# Patient Record
Sex: Female | Born: 1990 | Race: White | Hispanic: No | Marital: Single | Smoking: Never smoker
Health system: Southern US, Community
[De-identification: ages and names within clinical notes are randomized; demographics above are authoritative.]

## PROBLEM LIST (undated history)

## (undated) DIAGNOSIS — N83201 Unspecified ovarian cyst, right side: Secondary | ICD-10-CM

## (undated) DIAGNOSIS — N83202 Unspecified ovarian cyst, left side: Secondary | ICD-10-CM

## (undated) DIAGNOSIS — I10 Essential (primary) hypertension: Secondary | ICD-10-CM

---

## 2008-10-20 ENCOUNTER — Emergency Department (HOSPITAL_COMMUNITY): Admission: EM | Admit: 2008-10-20 | Discharge: 2008-10-20 | Payer: Self-pay | Admitting: Emergency Medicine

## 2008-11-29 ENCOUNTER — Inpatient Hospital Stay (HOSPITAL_COMMUNITY): Admission: AD | Admit: 2008-11-29 | Discharge: 2008-11-29 | Payer: Self-pay | Admitting: Obstetrics and Gynecology

## 2009-06-16 ENCOUNTER — Emergency Department (HOSPITAL_COMMUNITY): Admission: EM | Admit: 2009-06-16 | Discharge: 2009-06-16 | Payer: Self-pay | Admitting: Emergency Medicine

## 2010-03-23 LAB — URINE MICROSCOPIC-ADD ON

## 2010-03-23 LAB — URINALYSIS, ROUTINE W REFLEX MICROSCOPIC
Glucose, UA: NEGATIVE mg/dL
Specific Gravity, Urine: >1.03 — ABNORMAL HIGH (ref 1.005–1.030)
Urobilinogen, UA: 0.2 mg/dL (ref 0.0–1.0)
pH: 5 (ref 5.0–8.0)

## 2010-03-23 LAB — URINE CULTURE

## 2010-03-23 LAB — GLUCOSE, CAPILLARY

## 2010-04-08 LAB — WET PREP, GENITAL
Trich, Wet Prep: NONE SEEN
Yeast Wet Prep HPF POC: NONE SEEN

## 2010-04-08 LAB — URINALYSIS, ROUTINE W REFLEX MICROSCOPIC
Bilirubin Urine: NEGATIVE
Nitrite: NEGATIVE
Specific Gravity, Urine: 1.025 (ref 1.005–1.030)
Urobilinogen, UA: 0.2 mg/dL (ref 0.0–1.0)

## 2010-04-08 LAB — URINE MICROSCOPIC-ADD ON

## 2010-04-08 LAB — GC/CHLAMYDIA PROBE AMP, GENITAL: GC Probe Amp, Genital: POSITIVE — AB

## 2010-04-09 LAB — WET PREP, GENITAL
Clue Cells Wet Prep HPF POC: NONE SEEN
Trich, Wet Prep: NONE SEEN
WBC, Wet Prep HPF POC: NONE SEEN
Yeast Wet Prep HPF POC: NONE SEEN

## 2010-04-09 LAB — URINALYSIS, ROUTINE W REFLEX MICROSCOPIC
Bilirubin Urine: NEGATIVE
Glucose, UA: 100 mg/dL — AB
Ketones, ur: 15 mg/dL — AB
Nitrite: POSITIVE — AB
Protein, ur: >300 mg/dL — AB
Specific Gravity, Urine: 1.02 (ref 1.005–1.030)
Urobilinogen, UA: 2 mg/dL — ABNORMAL HIGH (ref 0.0–1.0)
pH: 5 (ref 5.0–8.0)

## 2010-04-09 LAB — GC/CHLAMYDIA PROBE AMP, GENITAL
Chlamydia, DNA Probe: POSITIVE — AB
GC Probe Amp, Genital: POSITIVE — AB

## 2010-04-09 LAB — URINE CULTURE: Colony Count: >=100000

## 2010-04-09 LAB — URINE MICROSCOPIC-ADD ON

## 2010-04-09 LAB — HCG, QUANTITATIVE, PREGNANCY: hCG, Beta Chain, Quant, S: <2 m[IU]/mL (ref <5)

## 2012-01-05 ENCOUNTER — Encounter (HOSPITAL_COMMUNITY): Payer: Self-pay | Admitting: *Deleted

## 2012-01-05 ENCOUNTER — Other Ambulatory Visit (HOSPITAL_COMMUNITY)
Admission: RE | Admit: 2012-01-05 | Discharge: 2012-01-05 | Disposition: A | Discharge status: Home / Self Care | Payer: BC Managed Care – PPO | Source: Ambulatory Visit | Attending: Emergency Medicine | Admitting: Emergency Medicine

## 2012-01-05 ENCOUNTER — Emergency Department (INDEPENDENT_AMBULATORY_CARE_PROVIDER_SITE_OTHER): Payer: BC Managed Care – PPO

## 2012-01-05 ENCOUNTER — Emergency Department (HOSPITAL_COMMUNITY)
Admission: EM | Admit: 2012-01-05 | Discharge: 2012-01-05 | Disposition: A | Discharge status: Home / Self Care | Payer: BC Managed Care – PPO | Source: Home / Self Care | Attending: Emergency Medicine | Admitting: Emergency Medicine

## 2012-01-05 DIAGNOSIS — J111 Influenza due to unidentified influenza virus with other respiratory manifestations: Secondary | ICD-10-CM

## 2012-01-05 DIAGNOSIS — R1031 Right lower quadrant pain: Secondary | ICD-10-CM

## 2012-01-05 DIAGNOSIS — Z113 Encounter for screening for infections with a predominantly sexual mode of transmission: Secondary | ICD-10-CM | POA: Insufficient documentation

## 2012-01-05 DIAGNOSIS — N912 Amenorrhea, unspecified: Secondary | ICD-10-CM

## 2012-01-05 DIAGNOSIS — N760 Acute vaginitis: Secondary | ICD-10-CM | POA: Insufficient documentation

## 2012-01-05 LAB — POCT URINALYSIS DIP (DEVICE)
Bilirubin Urine: NEGATIVE
Ketones, ur: NEGATIVE mg/dL
Nitrite: NEGATIVE

## 2012-01-05 LAB — POCT PREGNANCY, URINE: Preg Test, Ur: NEGATIVE

## 2012-01-05 MED ORDER — BENZONATATE 200 MG PO CAPS: 200.0000 mg | ORAL_CAPSULE | Freq: Three times a day (TID) | ORAL | Status: DC | PRN

## 2012-01-05 MED ORDER — CEPHALEXIN 500 MG PO CAPS: 500.0000 mg | ORAL_CAPSULE | Freq: Three times a day (TID) | ORAL | Status: DC

## 2012-01-05 MED ORDER — OSELTAMIVIR PHOSPHATE 75 MG PO CAPS: 75.0000 mg | ORAL_CAPSULE | Freq: Two times a day (BID) | ORAL | Status: DC

## 2012-01-05 NOTE — ED Notes (Signed)
C/O various sxs over past 2 months (vomiting, dizziness, intermittent abd pain) - was exposed to black mold (moved out 4 days ago).  Now c/o productive cough, nasal congestion x 4 days.  Has been taking Nyquil Daytime and Nyquil Nighttime.  Also c/o intermittent RLQ pains that "feel like a pulled muscle, but sometimes gets pretty severe, and I usually just sleep it off".  C/O frequent urination, but denies any dysuria.  Denies n/v/d.

## 2012-01-05 NOTE — ED Provider Notes (Signed)
Chief Complaint  Patient presents with  . Cough  . Nasal Congestion  . Abdominal Pain    History of Present Illness:   The patient is a 22 year old female who has had cough and nasal congestion going back about 2 months which he attributes to black mold in her apartment. She recently moved out, but for the past 4 days has had cough productive yellow sputum, nasal congestion with clear rhinorrhea, headache, temperature up to 102, sore throat, abdominal pain in the right groin area is been going on off and on for the past month, and she vomited 3 times today. She wonders if she could be pregnant. Her last menstrual period was November 20. She's had some nausea and vomiting and some urinary frequency.  Review of Systems:  Other than noted above, the patient denies any of the following symptoms. Systemic:  No fever, chills, sweats, fatigue, myalgias, headache, or anorexia. Eye:  No redness, pain or drainage. ENT:  No earache, ear congestion, nasal congestion, sneezing, rhinorrhea, sinus pressure, sinus pain, post nasal drip, or sore throat. Lungs:  No cough, sputum production, wheezing, shortness of breath, or chest pain. GI:  No abdominal pain, nausea, vomiting, or diarrhea.  PMFSH:  Past medical history, family history, social history, meds, and allergies were reviewed.  Physical Exam:   Vital signs:  BP 99/65  Pulse 94  Temp 98.9 F (37.2 C) (Oral)  Resp 18  SpO2 98%  LMP 11/24/2011 General:  Alert, in no distress. Eye:  No conjunctival injection or drainage. Lids were normal. ENT:  TMs and canals were normal, without erythema or inflammation.  Nasal mucosa was clear and uncongested, without drainage.  Mucous membranes were moist.  Pharynx was clear, without exudate or drainage.  There were no oral ulcerations or lesions. Neck:  Supple, no adenopathy, tenderness or mass. Lungs:  No respiratory distress.  Lungs were clear to auscultation, without wheezes, rales or rhonchi.  Breath sounds  were clear and equal bilaterally.  Heart:  Regular rhythm, without gallops, murmers or rubs. Abdomen: Soft, flat, nondistended. There is mild tenderness to palpation in the right lower quadrant and right flank without guarding rebound. No organomegaly or mass. Bowel sounds are normally active. Pelvic: Normal external genitalia, vaginal and cervical mucosa were normal. No cervical motion tenderness. Uterus was normal in size and shape and nontender to palpation. She has mild adnexal tenderness on the right, no mass, no adnexal tenderness or mass in the left. Skin:  Clear, warm, and dry, without rash or lesions.  Labs:   Results for orders placed during the hospital encounter of 01/05/12  POCT URINALYSIS DIP (DEVICE)      Component Value Range   Glucose, UA NEGATIVE  NEGATIVE mg/dL   Bilirubin Urine NEGATIVE  NEGATIVE   Ketones, ur NEGATIVE  NEGATIVE mg/dL   Specific Gravity, Urine 1.025  1.005 - 1.030   Hgb urine dipstick SMALL (*) NEGATIVE   pH 5.5  5.0 - 8.0   Protein, ur NEGATIVE  NEGATIVE mg/dL   Urobilinogen, UA 0.2  0.0 - 1.0 mg/dL   Nitrite NEGATIVE  NEGATIVE   Leukocytes, UA TRACE (*) NEGATIVE  POCT PREGNANCY, URINE      Component Value Range   Preg Test, Ur NEGATIVE  NEGATIVE    Radiology:  Dg Chest 2 View  01/05/2012  *RADIOLOGY REPORT*  Clinical Data: Cough and fever for 3 days.  CHEST - 2 VIEW  Comparison: None.  Findings: Midline trachea.  Normal heart size and  mediastinal contours. No pleural effusion or pneumothorax.  Lateral view degraded by patient arm position.  Clear lungs.  IMPRESSION: No acute cardiopulmonary disease.   Original Report Authenticated By: Jeronimo Greaves, M.D.    I reviewed the images independently and personally and concur with the radiologist's findings.  Assessment:  The primary encounter diagnosis was Influenza-like illness. Diagnoses of RLQ abdominal pain and Amenorrhea were also pertinent to this visit.  The cause of her right lower quadrant pain is  uncertain. Could be due to urinary tract infection and a culture was obtained. I went ahead and start her on cephalexin for this possibility. Also is possible it could be mild PID or ovarian cyst. I suggested she followup with a gynecologist and given the phone number of the faculty practice at [facility] hospital to call if the pain is persistent. She did not appear to be pregnant today. I told her that if her menses did not start in a week to repeat a pregnancy test.  Plan:   1.  The following meds were prescribed:   New Prescriptions   BENZONATATE (TESSALON) 200 MG CAPSULE    Take 1 capsule (200 mg total) by mouth 3 (three) times daily as needed for cough.   CEPHALEXIN (KEFLEX) 500 MG CAPSULE    Take 1 capsule (500 mg total) by mouth 3 (three) times daily.   OSELTAMIVIR (TAMIFLU) 75 MG CAPSULE    Take 1 capsule (75 mg total) by mouth every 12 (twelve) hours.   2.  The patient was instructed in symptomatic care and handouts were given. 3.  The patient was told to return if becoming worse in any way, if no better in 3 or 4 days, and given some red flag symptoms that would indicate earlier return.   Reuben Likes, MD 01/05/12 2039

## 2012-01-07 LAB — URINE CULTURE: Special Requests: NORMAL

## 2012-01-10 ENCOUNTER — Telehealth (HOSPITAL_COMMUNITY): Payer: Self-pay | Admitting: Emergency Medicine

## 2012-01-10 MED ORDER — METRONIDAZOLE 500 MG PO TABS: 500.0000 mg | ORAL_TABLET | Freq: Two times a day (BID) | ORAL | Status: DC

## 2012-01-10 MED ORDER — AZITHROMYCIN 500 MG PO TABS: 1000.0000 mg | ORAL_TABLET | Freq: Every day | ORAL | Status: DC

## 2012-01-10 NOTE — ED Notes (Signed)
The patient's DNA probes came back positive for Chlamydia and Gardnerella. We will send him prescriptions for Flagyl 500 mg twice a day for one week and azithromycin 1000 mg by mouth.  Reuben Likes, MD 01/10/12 1345

## 2012-01-10 NOTE — Telephone Encounter (Signed)
Message copied by Reuben Likes on Mon Jan 10, 2012  1:45 PM ------      Message from: Vassie Moselle      Created: Fri Jan 07, 2012 11:22 PM       Chlamydia pos., and Gardnerella pos.  Needs tx.      Vassie Moselle      01/07/2012

## 2012-01-13 ENCOUNTER — Telehealth (HOSPITAL_COMMUNITY): Payer: Self-pay | Admitting: *Deleted

## 2012-01-13 NOTE — ED Notes (Signed)
GC neg., Chlamydia pos., Affirm: Candida and Trich neg., Gardnerella pos.  Dr. Lorenz Coaster e-prescribed Zithromax and Flagyl to [facility].  I called and left a message to call.  Pt. Called right back.  Pt. verified x 2 and given results.  Pt. told she needs Zithromax for the Chlamydia and Flagyl for bacterial vaginosis and [facility] are at [facility] [address]   Pt. instructed to no alcohol while taking the Flagyl.  Pt. instructed to notify her partner, no sex for 1 week and to practice safe sex. Pt. told she can get HIV testing at the [facility]. STD clinic, by appointment.  Pt. voiced understanding.  DHHS form completed and faxed to the [facility]. Vassie Moselle 01/13/2012

## 2012-04-06 ENCOUNTER — Encounter: Payer: Self-pay | Admitting: Obstetrics & Gynecology

## 2012-04-06 ENCOUNTER — Ambulatory Visit (INDEPENDENT_AMBULATORY_CARE_PROVIDER_SITE_OTHER): Payer: BC Managed Care – PPO | Admitting: Obstetrics & Gynecology

## 2012-04-06 VITALS — BP 124/80 | HR 97 | Temp 98.8°F | Ht 64.0 in | Wt 253.0 lb

## 2012-04-06 DIAGNOSIS — Z01419 Encounter for gynecological examination (general) (routine) without abnormal findings: Secondary | ICD-10-CM

## 2012-04-06 DIAGNOSIS — N926 Irregular menstruation, unspecified: Secondary | ICD-10-CM

## 2012-04-06 DIAGNOSIS — N970 Female infertility associated with anovulation: Secondary | ICD-10-CM

## 2012-04-06 LAB — POCT URINALYSIS DIPSTICK
Bilirubin, UA: NEGATIVE
Glucose, UA: NEGATIVE
Leukocytes, UA: NEGATIVE
Nitrite, UA: NEGATIVE
Urobilinogen, UA: NEGATIVE
pH, UA: 5

## 2012-04-06 LAB — POCT URINE PREGNANCY: Preg Test, Ur: NEGATIVE

## 2012-04-06 NOTE — Progress Notes (Signed)
Subjective:     Diana Bowman is a 22 y.o. female here for a routine exam.  Current complaints: Pt states she is trying to conceive for 1 year. Pt states she is tracking her cycles and timing intercourse. Pt states she has been trying for about 1 year without success. Pt states she her cycles are 3-4 days but can go 6 weeks or more between cycles.    Gynecologic History Patient's last menstrual period was 03/29/2012. Contraception: none Last Pap: 2012. Results were: normal   The following portions of the patient's history were reviewed and updated as appropriate: allergies, current medications, past family history, past medical history, past social history, past surgical history and problem list.  Review of Systems Integument/breast: negative for acne, excessive hair Endocrine: negative for temperature intolerance    Objective:    BP 124/80  Pulse 97  Temp(Src) 98.8 F (37.1 C)  Ht 5\' 4"  (1.626 m)  Wt 114.76 kg (253 lb)  BMI 43.41 kg/m2  LMP 03/29/2012  General Appearance:    Alert, cooperative, no distress, appears stated age  Head:    Normocephalic, without obvious abnormality, atraumatic  Eyes:    PERRL, conjunctiva/corneas clear, EOM's intact, fundi    benign, both eyes  Ears:    Normal TM's and external ear canals, both ears  Nose:   Nares normal, septum midline, mucosa normal, no drainage    or sinus tenderness  Throat:   Lips, mucosa, and tongue normal; teeth and gums normal  Neck:   Supple, symmetrical, trachea midline, no adenopathy;    thyroid:  no enlargement/tenderness/nodules; no carotid   bruit or JVD  Back:     Symmetric, no curvature, ROM normal, no CVA tenderness  Lungs:     Clear to auscultation bilaterally, respirations unlabored  Chest Wall:    No tenderness or deformity   Heart:    Regular rate and rhythm, S1 and S2 normal, no murmur, rub   or gallop  Breast Exam:    No tenderness, masses, or nipple abnormality  Abdomen:     Soft, non-tender, bowel  sounds active all four quadrants,    no masses, no organomegaly  Genitalia:    Normal female without lesion, discharge or tenderness  Rectal:    Normal tone, normal prostate, no masses or tenderness;   guaiac negative stool  Extremities:   Extremities normal, atraumatic, no cyanosis or edema  Pulses:   2+ and symmetric all extremities  Skin:   Skin color, texture, turgor normal, no rashes or lesions  Lymph nodes:   Cervical, supraclavicular, and axillary nodes normal  Neurologic:   CNII-XII intact, normal strength, sensation and reflexes    throughout      Assessment:   Primary infertility Oligo ovulation--DDX simple obesity, PCOS  Plan:   Labs Ultrasound

## 2012-04-06 NOTE — Patient Instructions (Addendum)
Abnormal Uterine Bleeding Abnormal uterine bleeding can have many causes. Some cases are simply treated, while others are more serious. There are several kinds of bleeding that is considered abnormal, including:  Bleeding between periods.  Bleeding after sexual intercourse.  Spotting anytime in the menstrual cycle.  Bleeding heavier or more than normal.  Bleeding after menopause. CAUSES  There are many causes of abnormal uterine bleeding. It can be present in teenagers, pregnant women, women during their reproductive years, and women who have reached menopause. Your caregiver will look for the more common causes depending on your age, signs, symptoms and your particular circumstance. Most cases are not serious and can be treated. Even the more serious causes, like cancer of the female organs, can be treated adequately if found in the early stages. That is why all types of bleeding should be evaluated and treated as soon as possible. DIAGNOSIS  Diagnosing the cause may take several kinds of tests. Your caregiver may:  Take a complete history of the type of bleeding.  Perform a complete physical exam and Pap smear.  Take an ultrasound on the abdomen showing a picture of the female organs and the pelvis.  Inject dye into the uterus and Fallopian tubes and X-ray them (hysterosalpingogram).  Place fluid in the uterus and do an ultrasound (sonohysterogrqphy).  Take a CT scan to examine the female organs and pelvis.  Take an MRI to examine the female organs and pelvis. There is no X-ray involved with this procedure.  Look inside the uterus with a telescope that has a light at the end (hysteroscopy).  Scrap the inside of the uterus to get tissue to examine (Dilatation and Curettage, D&C).  Look into the pelvis with a telescope that has a light at the end (laparoscopy). This is done through a very small cut (incision) in the abdomen. TREATMENT  Treatment will depend on the cause of the  abnormal bleeding. It can include:  Doing nothing to allow the problem to take care of itself over time.  Hormone treatment.  Birth control pills.  Treating the medical condition causing the problem.  Laparoscopy.  Major or minor surgery  Destroying the lining of the uterus with electrical currant, laser, freezing or heat (uterine ablation). HOME CARE INSTRUCTIONS   Follow your caregiver's recommendation on how to treat your problem.  See your caregiver if you missed a menstrual period and think you may be pregnant.  If you are bleeding heavily, count the number of pads/tampons you use and how often you have to change them. Tell this to your caregiver.  Avoid sexual intercourse until the problem is controlled. SEEK MEDICAL CARE IF:   You have any kind of abnormal bleeding mentioned above.  You feel dizzy at times.  You are 22 years old and have not had a menstrual period yet. SEEK IMMEDIATE MEDICAL CARE IF:   You pass out.  You are changing pads/tampons every 15 to 30 minutes.  You have belly (abdominal) pain.  You have a temperature of 100 F (37.8 C) or higher.  You become sweaty or weak.  You are passing large blood clots from the vagina.  You start to feel sick to your stomach (nauseous) and throw up (vomit). Document Released: 12/21/2004 Document Revised: 03/15/2011 Document Reviewed: 05/16/2008 [facility] Patient Information 2013 [place], [place].  Infertility WHAT IS INFERTILITY?  Infertility is usually defined as not being able to get pregnant after trying for one year of regular sexual intercourse without the use of contraceptives.  Or not being able to carry a pregnancy to term and have a baby. The infertility rate in the [place] is around 10%. Pregnancy is the result of a chain of events. A woman must release an egg from one of her ovaries (ovulation). The egg must be fertilized by the female sperm. Then it travels through a fallopian tube into the  uterus (womb), where it attaches to the wall of the uterus and grows. A man must have enough sperm, and the sperm must join with (fertilize) the egg along the way, at the proper time. The fertilized egg must then become attached to the inside of the uterus. While this may seem simple, many things can happen to prevent pregnancy from occurring.  WHOSE PROBLEM IS IT?  About 20% of infertility cases are due to problems with the man (female factors) and 65% are due to problems with the woman (female factors). Other cases are due to a combination of female and female factors or to unknown causes.  WHAT CAUSES INFERTILITY IN MEN?  Infertility in men is often caused by problems with making enough normal sperm or getting the sperm to reach the egg. Problems with sperm may exist from birth or develop later in life, due to illness or injury. Some men produce no sperm, or produce too few sperm (oligospermia). Other problems include:  Sexual dysfunction.  Hormonal or endocrine problems.  Age. Female fertility decreases with age, but not at as young an age as female fertility.  Infection.  Congenital problems. Birth defect, such as absence of the tubes that carry the sperm (vas deferens).  Genetic/chromosomal problems.  Antisperm antibody problems.  Retrograde ejaculation (sperm go into the bladder).  Varicoceles, spematoceles, or tumors of the testicles.  Lifestyle can influence the number and quality of a man's sperm.  Alcohol and drugs can temporarily reduce sperm quality.  Environmental toxins, including pesticides and lead, may cause some cases of infertility in men. WHAT CAUSES INFERTILITY IN WOMEN?   Problems with ovulation account for most infertility in women. Without ovulation, eggs are not available to be fertilized.  Signs of problems with ovulation include irregular menstrual periods or no periods at all.  Simple lifestyle factors, including stress, diet, or athletic training, can affect  a woman's hormonal balance.  Age. Fertility begins to decrease in women in the early 80s and is worse after age 67.  Much less often, a hormonal imbalance from a serious medical problem, such as a pituitary gland tumor, thyroid or other chronic medical disease, can cause ovulation problems.  Pelvic infections.  Polycystic ovary syndrome (increase in female hormones, unable to ovulate).  Alcohol or illegal drugs.  Environmental toxins, radiation, pesticides, and certain chemicals.  Aging is an important factor in female infertility.  The ability of a woman's ovaries to produce eggs declines with age, especially after age 36. About one third of couples where the woman is over 35 will have problems with fertility.  By the time she reaches menopause when her monthly periods stop for good, a woman can no longer produce eggs or become pregnant.  Other problems can also lead to infertility in women. If the fallopian tubes are blocked at one or both ends, the egg cannot travel through the tubes into the uterus. Scar tissue (adhesions) in the pelvis may cause blocked tubes. This may result from pelvic inflammatory disease, endometriosis, or surgery for an ectopic pregnancy (fertilized egg implanted outside the uterus) or any pelvic or abdominal surgery causing adhesions.  Fibroid tumors or polyps of the uterus.  Congenital (birth defect) abnormalities of the uterus.  Infection of the cervix (cervicitis).  Cervical stenosis (narrowing).  Abnormal cervical mucus.  Polycystic ovary syndrome.  Having sexual intercourse too often (every other day or 4 to 5 times a week).  Obesity.  Anorexia.  Poor nutrition.  Over exercising, with loss of body fat.  DES. Your mother received diethylstilbesterol hormone when pregnant with you. HOW IS INFERTILITY TESTED?  If you have been trying to have a baby without success, you may want to seek medical help. You should not wait for one year of trying  before seeing a health care provider if:  You are over 35.  You have reason to believe that there may be a fertility problem. A medical evaluation may determine the reasons for a couple's infertility. Usually this process begins with:  Physical exams.  Medical histories of both partners.  Sexual histories of both partners. If there is no obvious problem, like improperly timed intercourse or absence of ovulation, tests may be needed.   For a man, testing usually begins with tests of his semen to look at:  The number of sperm.  The shape of sperm.  Movement of his sperm.  Taking a complete medical and surgical history.  Physical examination.  Check for infection of the female reproductive organs. Sometimes hormone tests are done.   For a woman, the first step in testing is to find out if she is ovulating each month. There are several ways to do this. For example, she can keep track of changes in her morning body temperature and in the texture of her cervical mucus. Another tool is a home ovulation test kit, which can be bought at drug or grocery stores.  Checks of ovulation can also be done in the doctor's office, using blood tests for hormone levels or ultrasound tests of the ovaries. If the woman is ovulating, more tests will need to be done. Some common female tests include:  Hysterosalpingogram: An x-ray of the fallopian tubes and uterus after they are injected with dye. It shows if the tubes are open and shows the shape of the uterus.  Laparoscopy: An exam of the tubes and other female organs for disease. A lighted tube called a laparoscope is used to see inside the abdomen.  Endometrial biopsy: Sample of uterus tissue taken on the first day of the menstrual period, to see if the tissue indicates you are ovulating.  Transvaginal ultrasound: Examines the female organs.  Hysteroscopy: Uses a lighted tube to examine the cervix and inside the uterus, to see if there are any  abnormalities inside the uterus. TREATMENT  Depending on the test results, different treatments can be suggested. The type of treatment depends on the cause. 85 to 90% of infertility cases are treated with drugs or surgery.   Various fertility drugs may be used for women with ovulation problems. It is important to talk with your caregiver about the drug to be used. You should understand the drug's benefits and side effects. Depending on the type of fertility drug and the dosage of the drug used, multiple births (twins or multiples) can occur in some women.  If needed, surgery can be done to repair damage to a woman's ovaries, fallopian tubes, cervix, or uterus.  Surgery or medical treatment for endometriosis or polycystic ovary syndrome. Sometimes a man has an infertility problem that can be corrected with medicine or by surgery.  Intrauterine insemination (IUI) of  sperm, timed with ovulation.  Change in lifestyle, if that is the cause (lose weight, increase exercise, and stop smoking, drinking excessively, or taking illegal drugs).  Other types of surgery:  Removing growths inside and on the uterus.  Removing scar tissue from inside of the uterus.  Fixing blocked tubes.  Removing scar tissue in the pelvis and around the female organs. WHAT IS ASSISTED REPRODUCTIVE TECHNOLOGY (ART)?  Assisted reproductive technology (ART) is another form of special methods used to help infertile couples. ART involves handling both the woman's eggs and the man's sperm. Success rates vary and depend on many factors. ART can be expensive and time-consuming. But ART has made it possible for many couples to have children that otherwise would not have been conceived. Some methods are listed below:  In vitro fertilization (IVF). IVF is often used when a woman's fallopian tubes are blocked or when a man has low sperm counts. A drug is used to stimulate the ovaries to produce multiple eggs. Once mature, the eggs are  removed and placed in a culture dish with the man's sperm for fertilization. After about 40 hours, the eggs are examined to see if they have become fertilized by the sperm and are dividing into cells. These fertilized eggs (embryos) are then placed in the woman's uterus. This bypasses the fallopian tubes.  Gamete intrafallopian transfer (GIFT) is similar to IVF, but used when the woman has at least one normal fallopian tube. Three to five eggs are placed in the fallopian tube, along with the man's sperm, for fertilization inside the woman's body.  Zygote intrafallopian transfer (ZIFT), also called tubal embryo transfer, combines IVF and GIFT. The eggs retrieved from the woman's ovaries are fertilized in the lab and placed in the fallopian tubes rather than in the uterus.  ART procedures sometimes involve the use of donor eggs (eggs from another woman) or previously frozen embryos. Donor eggs may be used if a woman has impaired ovaries or has a genetic disease that could be passed on to her baby.  When performing ART, you are at higher risk for resulting in multiple pregnancies, twins, triplets or more.  Intracytoplasma sperm injection is a procedure that injects a single sperm into the egg to fertilize it.  Embryo transplant is a procedure that starts after growing an embryo in a special media (chemical solution) developed to keep the embryo alive for 2 to 5 days, and then transplanting it into the uterus. In cases where a cause cannot be found and pregnancy does not occur, adoption may be a consideration. Document Released: 12/24/2002 Document Revised: 03/15/2011 Document Reviewed: 11/19/2008 [facility] Patient Information 2013 [place], [place].

## 2012-04-07 LAB — TESTOSTERONE, FREE, TOTAL, SHBG
Testosterone, Free: 11.9 pg/mL — ABNORMAL HIGH (ref 0.6–6.8)
Testosterone-% Free: 2.6 % — ABNORMAL HIGH (ref 0.4–2.4)

## 2012-04-07 LAB — HIV ANTIBODY (ROUTINE TESTING W REFLEX): HIV: NONREACTIVE

## 2012-04-07 LAB — PROLACTIN: Prolactin: 14.2 ng/mL

## 2012-04-10 LAB — PAP IG AND CT-NG NAA: GC Probe Amp: NEGATIVE

## 2012-04-10 LAB — 17-HYDROXYPROGESTERONE: 17-OH-Progesterone, LC/MS/MS: 39 ng/dL

## 2012-04-11 ENCOUNTER — Encounter: Payer: Self-pay | Admitting: Obstetrics & Gynecology

## 2012-04-11 DIAGNOSIS — R896 Abnormal cytological findings in specimens from other organs, systems and tissues: Secondary | ICD-10-CM

## 2012-04-19 ENCOUNTER — Other Ambulatory Visit: Payer: BC Managed Care – PPO

## 2012-04-26 ENCOUNTER — Other Ambulatory Visit: Payer: Self-pay | Admitting: Obstetrics & Gynecology

## 2012-04-26 ENCOUNTER — Ambulatory Visit: Payer: BC Managed Care – PPO | Admitting: Obstetrics & Gynecology

## 2012-04-26 DIAGNOSIS — N979 Female infertility, unspecified: Secondary | ICD-10-CM

## 2012-04-26 DIAGNOSIS — N926 Irregular menstruation, unspecified: Secondary | ICD-10-CM

## 2012-05-02 ENCOUNTER — Ambulatory Visit (INDEPENDENT_AMBULATORY_CARE_PROVIDER_SITE_OTHER): Payer: BC Managed Care – PPO

## 2012-05-02 DIAGNOSIS — N926 Irregular menstruation, unspecified: Secondary | ICD-10-CM

## 2012-05-02 DIAGNOSIS — N979 Female infertility, unspecified: Secondary | ICD-10-CM

## 2012-05-06 ENCOUNTER — Encounter: Payer: Self-pay | Admitting: Obstetrics

## 2012-05-19 ENCOUNTER — Ambulatory Visit: Payer: BC Managed Care – PPO | Admitting: Obstetrics & Gynecology

## 2012-05-29 ENCOUNTER — Encounter: Payer: Self-pay | Admitting: Obstetrics & Gynecology

## 2012-06-05 ENCOUNTER — Ambulatory Visit: Payer: BC Managed Care – PPO | Admitting: Obstetrics & Gynecology

## 2012-08-22 ENCOUNTER — Other Ambulatory Visit: Payer: Self-pay | Admitting: Obstetrics

## 2012-08-22 DIAGNOSIS — N63 Unspecified lump in unspecified breast: Secondary | ICD-10-CM

## 2012-08-31 ENCOUNTER — Other Ambulatory Visit: Payer: Self-pay | Admitting: Obstetrics

## 2012-08-31 ENCOUNTER — Ambulatory Visit
Admission: RE | Admit: 2012-08-31 | Discharge: 2012-08-31 | Disposition: A | Discharge status: Home / Self Care | Payer: BC Managed Care – PPO | Source: Ambulatory Visit | Attending: Obstetrics | Admitting: Obstetrics

## 2012-08-31 ENCOUNTER — Ambulatory Visit: Payer: BC Managed Care – PPO | Admitting: Obstetrics & Gynecology

## 2012-08-31 DIAGNOSIS — N63 Unspecified lump in unspecified breast: Secondary | ICD-10-CM

## 2012-09-05 ENCOUNTER — Other Ambulatory Visit: Payer: BC Managed Care – PPO

## 2012-09-05 ENCOUNTER — Other Ambulatory Visit: Payer: Self-pay | Admitting: Obstetrics & Gynecology

## 2012-09-05 DIAGNOSIS — N63 Unspecified lump in unspecified breast: Secondary | ICD-10-CM

## 2012-09-07 ENCOUNTER — Ambulatory Visit: Payer: BC Managed Care – PPO | Admitting: Obstetrics & Gynecology

## 2012-09-07 ENCOUNTER — Ambulatory Visit
Admission: RE | Admit: 2012-09-07 | Discharge: 2012-09-07 | Disposition: A | Discharge status: Home / Self Care | Payer: BC Managed Care – PPO | Source: Ambulatory Visit | Attending: Obstetrics & Gynecology | Admitting: Obstetrics & Gynecology

## 2012-09-07 DIAGNOSIS — N63 Unspecified lump in unspecified breast: Secondary | ICD-10-CM

## 2012-11-26 ENCOUNTER — Emergency Department (HOSPITAL_COMMUNITY)
Admission: EM | Admit: 2012-11-26 | Discharge: 2012-11-26 | Disposition: A | Discharge status: Home / Self Care | Payer: BC Managed Care – PPO | Attending: Emergency Medicine | Admitting: Emergency Medicine

## 2012-11-26 ENCOUNTER — Emergency Department (INDEPENDENT_AMBULATORY_CARE_PROVIDER_SITE_OTHER)
Admission: EM | Admit: 2012-11-26 | Discharge: 2012-11-26 | Disposition: A | Discharge status: Hospice | Payer: BC Managed Care – PPO | Source: Home / Self Care | Attending: Emergency Medicine | Admitting: Emergency Medicine

## 2012-11-26 ENCOUNTER — Encounter (HOSPITAL_COMMUNITY): Payer: Self-pay | Admitting: Emergency Medicine

## 2012-11-26 DIAGNOSIS — N12 Tubulo-interstitial nephritis, not specified as acute or chronic: Secondary | ICD-10-CM

## 2012-11-26 DIAGNOSIS — R509 Fever, unspecified: Secondary | ICD-10-CM | POA: Insufficient documentation

## 2012-11-26 DIAGNOSIS — Z3202 Encounter for pregnancy test, result negative: Secondary | ICD-10-CM | POA: Insufficient documentation

## 2012-11-26 DIAGNOSIS — R630 Anorexia: Secondary | ICD-10-CM | POA: Insufficient documentation

## 2012-11-26 DIAGNOSIS — R112 Nausea with vomiting, unspecified: Secondary | ICD-10-CM | POA: Insufficient documentation

## 2012-11-26 DIAGNOSIS — R109 Unspecified abdominal pain: Secondary | ICD-10-CM

## 2012-11-26 DIAGNOSIS — R197 Diarrhea, unspecified: Secondary | ICD-10-CM | POA: Insufficient documentation

## 2012-11-26 LAB — COMPREHENSIVE METABOLIC PANEL
ALT: 35 U/L (ref 0–35)
AST: 23 U/L (ref 0–37)
Albumin: 3.7 g/dL (ref 3.5–5.2)
Alkaline Phosphatase: 76 U/L (ref 39–117)
BUN: 12 mg/dL (ref 6–23)
CO2: 21 mEq/L (ref 19–32)
Calcium: 8.6 mg/dL (ref 8.4–10.5)
Chloride: 109 mEq/L (ref 96–112)
Creatinine, Ser: 0.61 mg/dL (ref 0.50–1.10)
GFR calc Af Amer: >90 mL/min (ref >90)
GFR calc non Af Amer: >90 mL/min (ref >90)
Glucose, Bld: 117 mg/dL — ABNORMAL HIGH (ref 70–99)
Potassium: 4 mEq/L (ref 3.5–5.1)
Sodium: 142 mEq/L (ref 135–145)
Total Bilirubin: 0.6 mg/dL (ref 0.3–1.2)
Total Protein: 7.1 g/dL (ref 6.0–8.3)

## 2012-11-26 LAB — LIPASE, BLOOD: Lipase: 17 U/L (ref 11–59)

## 2012-11-26 LAB — URINALYSIS, ROUTINE W REFLEX MICROSCOPIC
Glucose, UA: NEGATIVE mg/dL
Protein, ur: NEGATIVE mg/dL
Specific Gravity, Urine: 1.024 (ref 1.005–1.030)
pH: 6 (ref 5.0–8.0)

## 2012-11-26 LAB — CBC
HCT: 44.6 % (ref 36.0–46.0)
Hemoglobin: 15.7 g/dL — ABNORMAL HIGH (ref 12.0–15.0)
MCH: 31.7 pg (ref 26.0–34.0)
MCHC: 35.2 g/dL (ref 30.0–36.0)
MCV: 90.1 fL (ref 78.0–100.0)
Platelets: 269 10*3/uL (ref 150–400)
RBC: 4.95 MIL/uL (ref 3.87–5.11)
RDW: 13.6 % (ref 11.5–15.5)
WBC: 19.8 10*3/uL — ABNORMAL HIGH (ref 4.0–10.5)

## 2012-11-26 LAB — URINE MICROSCOPIC-ADD ON

## 2012-11-26 MED ORDER — SODIUM CHLORIDE 0.9 % IV SOLN
Freq: Once | INTRAVENOUS | Status: AC
  Administered 2012-11-26: 10:00:00 via INTRAVENOUS

## 2012-11-26 MED ORDER — CIPROFLOXACIN HCL 500 MG PO TABS: 500.0000 mg | ORAL_TABLET | Freq: Two times a day (BID) | ORAL | Status: DC

## 2012-11-26 MED ORDER — ONDANSETRON HCL 4 MG PO TABS: 4.0000 mg | ORAL_TABLET | Freq: Four times a day (QID) | ORAL | Status: DC

## 2012-11-26 MED ORDER — ONDANSETRON HCL 4 MG/2ML IJ SOLN
4.0000 mg | Freq: Once | INTRAMUSCULAR | Status: AC
  Administered 2012-11-26: 4 mg via INTRAVENOUS
  Filled 2012-11-26: qty 2

## 2012-11-26 MED ORDER — ONDANSETRON 4 MG PO TBDP
ORAL_TABLET | ORAL | Status: AC
  Filled 2012-11-26: qty 2

## 2012-11-26 MED ORDER — SODIUM CHLORIDE 0.9 % IV BOLUS (SEPSIS)
1000.0000 mL | INTRAVENOUS | Status: AC
  Administered 2012-11-26: 1000 mL via INTRAVENOUS

## 2012-11-26 MED ORDER — ONDANSETRON 4 MG PO TBDP
8.0000 mg | ORAL_TABLET | Freq: Once | ORAL | Status: AC
  Administered 2012-11-26: 8 mg via ORAL

## 2012-11-26 NOTE — ED Notes (Signed)
C/o vomiting and diarrhea since 2:30am States she thinks she was food poison Ate at [employer]. States partner ate at restaurant and vomited once and was fine after that.

## 2012-11-26 NOTE — ED Provider Notes (Signed)
CSN: [id number]     Arrival date & time 11/26/12  1148 History   First MD Initiated Contact with Patient 11/26/12 1151     Chief Complaint  Patient presents with  . Abdominal Pain   (Consider location/radiation/quality/duration/timing/severity/associated sxs/prior Treatment) HPI Pt is a 22yo female c/o generalized abdominal pain that is aching and sore associated with n/v/d that started 12 hours ago. Pt initially went to [facility] but was directed to ED for further evaluation of abdominal pain for further workup of possible cholecystitis vs gastroenteritis.  Pt reports eating [place] food that consisted of various meats and cheeses.  After that, she began to feel sick.  Her friend who was also eating with her vomited one time but was better after that, he also ate something different than the pt.  Pt reports she has vomitined 7-8 times since 2:30am this morning and had 5-6 episodes of watery diarrhea.  Has not tried anything at home for symptoms. Denies urinary or vaginal symptoms.  Denies recent travel. Denies hx of previous abdominal problems or surgeries. LKMP: September, pt reports normally has irregular periods, is not on birth control.   History reviewed. No pertinent past medical history. History reviewed. No pertinent past surgical history. Family History  Problem Relation Age of Onset  . Diabetes Maternal Grandmother   . Heart attack Maternal Grandfather   . Kidney disease Paternal Grandfather   . Diabetes Paternal Grandfather   . Diabetes Maternal Aunt    History  Substance Use Topics  . Smoking status: Never Smoker   . Smokeless tobacco: Not on file  . Alcohol Use: Yes     Comment: occasional   OB History   Grav Para Term Preterm Abortions TAB SAB Ect Mult Living                 Review of Systems  Constitutional: Positive for fever ( "low grade") and appetite change. Negative for chills and unexpected weight change.  Respiratory: Negative for shortness of breath.    Cardiovascular: Negative for chest pain.  Gastrointestinal: Positive for nausea, vomiting, abdominal pain ( centralized) and diarrhea. Negative for constipation.  Genitourinary: Negative for dysuria, frequency, hematuria, flank pain, decreased urine volume, vaginal bleeding, vaginal discharge, vaginal pain and menstrual problem.  All other systems reviewed and are negative.    Allergies  Review of patient's allergies indicates no known allergies.  Home Medications   Current Outpatient Rx  Name  Route  Sig  Dispense  Refill  . ondansetron (ZOFRAN) 4 MG tablet   Oral   Take 1 tablet (4 mg total) by mouth every 6 (six) hours.   12 tablet   0   . ciprofloxacin (CIPRO) 500 MG tablet   Oral   Take 1 tablet (500 mg total) by mouth every 12 (twelve) hours.   20 tablet   0   . ondansetron (ZOFRAN) 4 MG tablet   Oral   Take 1 tablet (4 mg total) by mouth every 6 (six) hours.   12 tablet   0    BP 117/81  Pulse 113  Temp(Src) 101 F (38.3 C) (Oral)  Resp 16  SpO2 98%  LMP 09/23/2012 Physical Exam  Nursing note and vitals reviewed. Constitutional: She appears well-developed and well-nourished. No distress.  Morbidly obese female lying in exam bed, holding emesis bag. No active vomiting.  HENT:  Head: Normocephalic and atraumatic.  Mouth/Throat: Oropharynx is clear and moist. No oropharyngeal exudate.  Eyes: Conjunctivae are normal. No scleral icterus.  Neck: Normal range of motion. Neck supple.  Cardiovascular: Normal rate, regular rhythm and normal heart sounds.   Pulmonary/Chest: Effort normal and breath sounds normal. No respiratory distress. She has no wheezes. She has no rales. She exhibits no tenderness.  Abdominal: Soft. She exhibits no distension and no mass. Bowel sounds are increased. There is tenderness ( mild, diffuse tenderness). There is no rebound and no guarding.  Obese abdomen, increased bowel sounds, soft, mild diffuse tenderness. Exam limited due to body  habitus.   Musculoskeletal: Normal range of motion.  Neurological: She is alert.  Skin: Skin is warm and dry. She is not diaphoretic.    ED Course  Procedures (including critical care time) Labs Review Labs Reviewed  CBC - Abnormal; Notable for the following:    WBC 19.8 (*)    Hemoglobin 15.7 (*)    All other components within normal limits  COMPREHENSIVE METABOLIC PANEL - Abnormal; Notable for the following:    Glucose, Bld 117 (*)    All other components within normal limits  URINALYSIS, ROUTINE W REFLEX MICROSCOPIC - Abnormal; Notable for the following:    APPearance CLOUDY (*)    Hgb urine dipstick TRACE (*)    Nitrite POSITIVE (*)    Leukocytes, UA TRACE (*)    All other components within normal limits  URINE MICROSCOPIC-ADD ON - Abnormal; Notable for the following:    Squamous Epithelial / LPF FEW (*)    Bacteria, UA MANY (*)    All other components within normal limits  URINE CULTURE  LIPASE, BLOOD  POCT PREGNANCY, URINE   Imaging Review No results found.  EKG Interpretation   None       MDM   1. Pyelonephritis   2. Nausea vomiting and diarrhea    Pt presenting with n/v/d. Reports friend who ate same food had similar, but less severe GI symptoms.  Due to number of episodes of vomiting and diarrhea, as well as UCC concern for cholecystitis with positive Murphy sign at urgent care, although diffuse mild tenderness on exam in ED, will get blood work.  Symptoms most likely due to gastroenteritis from food, however want to ensure no other underlying cause for pt's symptoms as well as check for hypokalemia.  Pt has hx of irregular menstrual cycles but not on birth control. Will get urine pregnancy. Pt denies urinary or vaginal symptoms, do not believe pelvic exam is indicated at this time.  Tx: fluids and zofran.    Urine preg: negative  Labs: CBC- elevated WBC: 19.8 CMP: unremarkable Lipase: WNL  1:21PM- Pt resting comfortably in exam bed. States she just feels  a little uncomfortable but nausea is gone and she does not want any pain medication at this time.  Discussed labs with Dr. Silverio Lay,  Will check UA for another cause of elevated WBC UA: evidence of UTI, will tx for pyelonephritis as pt has systemic symptoms that may not be related solely to gastroenteritis.  Pt is still denies abdominal pain and is non-tender on exam. Do not believe CT or U/S abdomen is needed at this time.  All labs/imaging/findings discussed with patient. All questions answered and concerns addressed. Will discharge pt home and have pt f/u with [facility] and [facility] info provided. Return precautions given. Pt verbalized understanding and agreement with tx plan. Vitals: unremarkable. Discharged in stable condition.    Discussed pt with Dr. Silverio Lay during ED encounter and agrees with plan.       Junius Finner, PA-C  11/26/12 1451 

## 2012-11-26 NOTE — ED Notes (Signed)
Pt is unable to give urine specimen at this time. The patient has been advised to use call light for assistance to the restroom. The tech has reported to the RN in charge.

## 2012-11-26 NOTE — ED Provider Notes (Signed)
CSN: [id number]     Arrival date & time 11/26/12  4098 History   First MD Initiated Contact with Patient 11/26/12 [phone]     Chief Complaint  Patient presents with  . Emesis  . Diarrhea   (Consider location/radiation/quality/duration/timing/severity/associated sxs/prior Treatment) Patient is a 22 y.o. female presenting with vomiting and diarrhea. The history is provided by the patient.  Emesis Severity:  Severe Duration:  12 hours Timing:  Constant Associated symptoms: abdominal pain and diarrhea   Associated symptoms: no chills   Diarrhea Associated symptoms: abdominal pain and vomiting   Associated symptoms: no chills and no fever    Presents today with onset of vomiting and diarrhea approximately 4 hours after eating out at a restaurant last night. Patient states that the person that she ate out with was also ill but only vomited one time and that he ate something different.    History reviewed. No pertinent past medical history. History reviewed. No pertinent past surgical history. Family History  Problem Relation Age of Onset  . Diabetes Maternal Grandmother   . Heart attack Maternal Grandfather   . Kidney disease Paternal Grandfather   . Diabetes Paternal Grandfather   . Diabetes Maternal Aunt    History  Substance Use Topics  . Smoking status: Never Smoker   . Smokeless tobacco: Not on file  . Alcohol Use: Yes     Comment: occasional   OB History   Grav Para Term Preterm Abortions TAB SAB Ect Mult Living                 Review of Systems  Constitutional: Negative for fever and chills.  HENT: Negative.   Eyes: Negative.   Respiratory: Negative.   Cardiovascular: Negative.   Gastrointestinal: Positive for vomiting, abdominal pain and diarrhea. Negative for constipation.  Endocrine: Negative.   Genitourinary: Negative.   Allergic/Immunologic: Negative.   Neurological: Negative.   Hematological: Negative.   Psychiatric/Behavioral: Negative.    Denies  history of gallbladder disease or illness.  Denies any sick contacts. Allergies  Review of patient's allergies indicates no known allergies.  Home Medications   Current Outpatient Rx  Name  Route  Sig  Dispense  Refill  . ondansetron (ZOFRAN) 4 MG tablet   Oral   Take 1 tablet (4 mg total) by mouth every 6 (six) hours.   12 tablet   0    BP 112/71  Pulse 104  Temp(Src) 100.2 F (37.9 C) (Oral)  Resp 18  SpO2 98%  LMP 09/23/2012  Physical Exam  Nursing note and vitals reviewed. Constitutional: She is oriented to person, place, and time. She appears well-developed and well-nourished. No distress.  Cardiovascular: Normal rate, regular rhythm, normal heart sounds and intact distal pulses.  Exam reveals no gallop and no friction rub.   No murmur heard. Pulmonary/Chest: Effort normal and breath sounds normal. No respiratory distress. She has no wheezes. She has no rales. She exhibits no tenderness.  Abdominal: Soft. She exhibits no mass. There is tenderness.  Positive tenderness right upper quadrant of abdomen with positive Murphy sign. Hyperactive bowel sounds throughout. Examination limited by body habitus.  Neurological: She is alert and oriented to person, place, and time.  Skin: Skin is warm and dry. No rash noted. She is not diaphoretic. No erythema. No pallor.   Patient tolerated IV fluids and ice chips well during course of stay. Towards end of IV fluid management patient became increasingly uncomfortable and reported increased right upper quadrant abdominal pain.  The patient is now tearful and crying stating that discomfort is 10/10.  ED Course  Procedures (including critical care time) Labs Review Labs Reviewed - No data to display Imaging Review No results found.   MDM   No diagnosis found.  Differentials: Gastroenteritis Acute Cholecystitis   Plan of care discussed with Dr. Lorenz Coaster. Patient to be transferred to [facility] for further evaluation of abdominal  pain.    Weber Cooks, NP 11/26/12 1131

## 2012-11-26 NOTE — ED Notes (Signed)
Initiated fluid challenge with ginger ale.

## 2012-11-26 NOTE — ED Provider Notes (Signed)
Medical screening examination/treatment/procedure(s) were performed by non-physician practitioner and as supervising physician I was immediately available for consultation/collaboration.  Leslee Home, M.D.  Reuben Likes, MD 11/26/12 [phone]

## 2012-11-26 NOTE — ED Notes (Signed)
Pt arrives to ed from urgent care for abd pain, NV x 1 day.  Pt sts RUQ pain 10/10.  Pt caox4, pmsx4, nad.  Pt denies hematemesis, recent illness/injury.

## 2012-11-28 LAB — URINE CULTURE

## 2012-11-28 NOTE — ED Provider Notes (Signed)
Medical screening examination/treatment/procedure(s) were performed by non-physician practitioner and as supervising physician I was immediately available for consultation/collaboration.  EKG Interpretation   None         Richardean Canal, MD 11/28/12 0900

## 2012-11-29 ENCOUNTER — Telehealth (HOSPITAL_BASED_OUTPATIENT_CLINIC_OR_DEPARTMENT_OTHER): Payer: Self-pay | Admitting: *Deleted

## 2012-11-29 NOTE — Telephone Encounter (Signed)
Pt prescribed cipro for uti. Bacteria was sensitive to the same. No further treatment recommended by Artelia Laroche, PharmD.

## 2013-01-10 ENCOUNTER — Encounter (HOSPITAL_COMMUNITY): Payer: Self-pay | Admitting: Emergency Medicine

## 2013-01-10 ENCOUNTER — Emergency Department (INDEPENDENT_AMBULATORY_CARE_PROVIDER_SITE_OTHER)
Admission: EM | Admit: 2013-01-10 | Discharge: 2013-01-10 | Disposition: A | Discharge status: Home / Self Care | Payer: BC Managed Care – PPO | Source: Home / Self Care | Attending: Emergency Medicine | Admitting: Emergency Medicine

## 2013-01-10 DIAGNOSIS — K5289 Other specified noninfective gastroenteritis and colitis: Secondary | ICD-10-CM

## 2013-01-10 DIAGNOSIS — K529 Noninfective gastroenteritis and colitis, unspecified: Secondary | ICD-10-CM

## 2013-01-10 MED ORDER — CIPROFLOXACIN HCL 500 MG PO TABS: 500.0000 mg | ORAL_TABLET | Freq: Two times a day (BID) | ORAL | Status: DC

## 2013-01-10 MED ORDER — ONDANSETRON 4 MG PO TBDP
ORAL_TABLET | ORAL | Status: AC
  Filled 2013-01-10: qty 2

## 2013-01-10 MED ORDER — METRONIDAZOLE 500 MG PO TABS: 500.0000 mg | ORAL_TABLET | Freq: Three times a day (TID) | ORAL | Status: DC

## 2013-01-10 MED ORDER — ONDANSETRON 8 MG PO TBDP: 8.0000 mg | ORAL_TABLET | Freq: Three times a day (TID) | ORAL | Status: DC | PRN

## 2013-01-10 MED ORDER — ONDANSETRON 4 MG PO TBDP
8.0000 mg | ORAL_TABLET | Freq: Once | ORAL | Status: AC
  Administered 2013-01-10: 8 mg via ORAL

## 2013-01-10 NOTE — Discharge Instructions (Signed)

## 2013-01-10 NOTE — ED Provider Notes (Signed)
Chief Complaint   Chief Complaint  Patient presents with  . Abdominal Pain     History of Present Illness   Diana Bowman is a 23 year old female who has had a five-day history of nausea and vomiting with streaks of red blood in. She denies any hematemesis and her blood, coffee-ground emesis, or bilious emesis. She's also had diarrhea with streaks of bright red blood as well. She denies any melena. She's had intermittent fevers and felt achy and chilled. She has had some crampy, upper abdominal pain with this. She denies any suspicious exposures, ingestions, foreign travel, or animal exposure.  Review of Systems   Other than as noted above, the patient denies any of the following symptoms: Systemic:  No fevers, chills, sweats, weight loss or gain, fatigue, or tiredness. ENT:  No nasal congestion, rhinorrhea, or sore throat. Lungs:  No cough, wheezing, or shortness of breath. Cardiac:  No chest pain, syncope, or presyncope. GI:  No abdominal pain, nausea, vomiting, anorexia, diarrhea, constipation, blood in stool or vomitus. GU:  No dysuria, frequency, or urgency.  PMFSH   Past medical history, family history, social history, meds, and allergies were reviewed.   Physical Exam     Vital signs:  BP 108/60  Pulse 90  Temp(Src) 98.5 F (36.9 C) (Oral)  Resp 16  SpO2 100%  LMP 09/17/2012 General:  Alert and oriented.  In no distress.  Skin warm and dry.  Good skin turgor, brisk capillary refill. ENT:  No scleral icterus, moist mucous membranes, no oral lesions, pharynx clear. Lungs:  Breath sounds clear and equal bilaterally.  No wheezes, rales, or rhonchi. Heart:  Rhythm regular, without extrasystoles.  No gallops or murmers. Abdomen:  Soft, flat, nondistended. No organomegaly or mass. Bowel sounds are hyperactive. Patient has mild, generalized tenderness to palpation without any localized tenderness to palpation, guarding, or rebound tenderness. Skin: Clear, warm, and dry.   Good turgor.  Brisk capillary refill.   Course in Urgent Care Center   She was given Zofran ODT 8 mg sublingually.   Assessment   The encounter diagnosis was Gastroenteritis.  Viral versus bacterial, however I am leaning towards bacterial since this is been going on for 5 days, she has some fever and chills with it and some blood in the vomitus and stool. A stool culture has been obtained.  Plan   1.  Meds:  The following meds were prescribed:   Discharge Medication List as of 01/10/2013  8:42 AM    START taking these medications   Details  !! ciprofloxacin (CIPRO) 500 MG tablet Take 1 tablet (500 mg total) by mouth every 12 (twelve) hours., Starting 01/10/2013, Until Discontinued, Normal    metroNIDAZOLE (FLAGYL) 500 MG tablet Take 1 tablet (500 mg total) by mouth 3 (three) times daily., Starting 01/10/2013, Until Discontinued, Normal    ondansetron (ZOFRAN ODT) 8 MG disintegrating tablet Take 1 tablet (8 mg total) by mouth every 8 (eight) hours as needed for nausea., Starting 01/10/2013, Until Discontinued, Normal     !! - Potential duplicate medications found. Please discuss with provider.      2.  Patient Education/Counseling:  The patient was given appropriate handouts, self care instructions, and instructed in symptomatic relief. The patient was told to stay on clear liquids for the remainder of the day, then advance to a B.R.A.T. diet starting tomorrow.  3.  Follow up:  The patient was told to follow up here if no better in 2 to 3 days,  or sooner if becoming worse in any way, and given some red flag symptoms such as persistent vomitng, high fever, severe abdominal pain, or any GI bleeding which would prompt immediate return.        Reuben Likes, MD 01/10/13 [phone]

## 2013-01-10 NOTE — ED Notes (Signed)
Dr. Lorenz CoasterKeller has already seen pt.  Pt c/o abd pain sxs that include: vomiting and diarrhea... Seen here on 11/26/12 for similar sxs She is alert w/no signs of acute distress.

## 2013-01-14 LAB — STOOL CULTURE

## 2013-06-11 ENCOUNTER — Ambulatory Visit: Payer: BC Managed Care – PPO | Admitting: Obstetrics

## 2013-08-29 ENCOUNTER — Ambulatory Visit: Payer: BC Managed Care – PPO | Admitting: Obstetrics & Gynecology

## 2013-09-12 ENCOUNTER — Encounter: Payer: Self-pay | Admitting: Obstetrics & Gynecology

## 2013-09-12 ENCOUNTER — Ambulatory Visit (INDEPENDENT_AMBULATORY_CARE_PROVIDER_SITE_OTHER): Payer: BC Managed Care – PPO | Admitting: Obstetrics & Gynecology

## 2013-09-12 VITALS — BP 115/80 | HR 76 | Temp 97.9°F | Ht 65.0 in | Wt 271.0 lb

## 2013-09-12 DIAGNOSIS — Z23 Encounter for immunization: Secondary | ICD-10-CM

## 2013-09-12 DIAGNOSIS — Z01419 Encounter for gynecological examination (general) (routine) without abnormal findings: Secondary | ICD-10-CM

## 2013-09-12 DIAGNOSIS — Z7689 Persons encountering health services in other specified circumstances: Secondary | ICD-10-CM

## 2013-09-12 LAB — CBC
HCT: 44.5 % (ref 36.0–46.0)
HEMOGLOBIN: 15.5 g/dL — AB (ref 12.0–15.0)
MCH: 31 pg (ref 26.0–34.0)
MCHC: 34.8 g/dL (ref 30.0–36.0)
MCV: 89 fL (ref 78.0–100.0)
Platelets: 339 10*3/uL (ref 150–400)
RBC: 5 MIL/uL (ref 3.87–5.11)
RDW: 14 % (ref 11.5–15.5)
WBC: 12 10*3/uL — ABNORMAL HIGH (ref 4.0–10.5)

## 2013-09-12 LAB — HEMOGLOBIN A1C
Hgb A1c MFr Bld: 5.6 % (ref <5.7)
MEAN PLASMA GLUCOSE: 114 mg/dL (ref <117)

## 2013-09-12 NOTE — Progress Notes (Signed)
Subjective:     Diana Bowman is a 23 y.o. female here for a routine exam.  Current complaints: none.    Personal health questionnaire:  Is patient Ashkenazi Jewish, have a family history of breast and/or ovarian cancer: no Is there a family history of uterine cancer diagnosed at age < 27, gastrointestinal cancer, urinary tract cancer, family member who is a Personnel officer syndrome-associated carrier: no Is the patient overweight and hypertensive, family history of diabetes, personal history of gestational diabetes or PCOS: yes Is patient over 40, have PCOS,  family history of premature CHD under age 90, diabetes, smoke, have hypertension or peripheral artery disease:  no  At any time, has a partner hit, kicked or otherwise hurt or frightened you?: no Over the past 2 weeks, have you felt down, depressed or hopeless?: no Over the past 2 weeks, have you felt little interest or pleasure in doing things?:no   Gynecologic History Patient's last menstrual period was 06/26/2013. Contraception: abstinence Last Pap: 2014. Results were: ASCUS   Obstetric History OB History  No data available    History reviewed. No pertinent past medical history.  History reviewed. No pertinent past surgical history.  No current outpatient prescriptions on file. No Known Allergies  History  Substance Use Topics  . Smoking status: Never Smoker   . Smokeless tobacco: Not on file  . Alcohol Use: Yes     Comment: occasional    Family History  Problem Relation Age of Onset  . Diabetes Maternal Grandmother   . Heart attack Maternal Grandfather   . Kidney disease Paternal Grandfather   . Diabetes Paternal Grandfather   . Diabetes Maternal Aunt   . Thrombosis Father       Review of Systems  Constitutional: negative for fatigue and weight loss Respiratory: negative for cough and wheezing Cardiovascular: negative for chest pain, fatigue and palpitations Gastrointestinal: negative for abdominal pain and  change in bowel habits Musculoskeletal:negative for myalgias Neurological: negative for gait problems and tremors Behavioral/Psych: negative for abusive relationship, depression Endocrine: negative for temperature intolerance   Genitourinary:negative for abnormal menstrual periods, genital lesions, hot flashes, sexual problems and vaginal discharge Integument/breast: negative for breast lump, breast tenderness, nipple discharge and skin lesion(s)    Objective:       BP 115/80  Pulse 76  Temp(Src) 97.9 F (36.6 C)  Ht  (1.651 m)  Wt 122.925 kg (271 lb)  BMI 45.10 kg/m2  LMP 06/26/2013 General:  Alert, oriented x 4, well-developed obese female with no acute distress observed.  Skin:   no rash or abnormalities  Lungs:   clear to auscultation bilaterally  Heart:   regular rate and rhythm, S1, S2 normal, no murmur, click, rub or gallop  Breasts:   normal without suspicious masses, skin or nipple changes or axillary nodes  Abdomen:  normal findings: no organomegaly, soft, non-tender and no hernia  Pelvis:  External genitalia: normal general appearance Urinary system: urethral meatus normal and bladder without fullness, nontender Vaginal: normal without tenderness, induration or masses Cervix: normal appearance Adnexa: normal bimanual exam Uterus: anteverted and non-tender, normal size   Lab Review Urine pregnancy test Labs reviewed yes Radiologic studies reviewed no    Assessment:    Healthy female exam.   EPDS negative H/O ASCUS Pap smear Plan:     * STD testing today. * Return for Skyla insertion  * Encouraged lifestyle change with exercise and nutritional management to decrease weight. * Patient desires nutritional referral for diet  counseling * Encouraged to increase exercise, daily. * Education provided re: catch-up HPV vaccine *Colposcopy if Pap smear today shows ASCUS Orders Placed This Encounter  Procedures  . Flu Vaccine QUAD 36+ mos IM (Fluarix)  .  CBC  . HIV antibody  . RPR  . Hemoglobin A1c  . Ambulatory referral to Nutrition and Diabetic Education    Referral Priority:  Routine    Referral Type:  Consultation    Referral Reason:  Specialty Services Required    Number of Visits Requested:  1

## 2013-09-12 NOTE — Patient Instructions (Signed)
Exercise to Lose Weight Exercise and a healthy diet may help you lose weight. Your doctor may suggest specific exercises. EXERCISE IDEAS AND TIPS  Choose low-cost things you enjoy doing, such as walking, bicycling, or exercising to workout videos.  Take stairs instead of the elevator.  Walk during your lunch break.  Park your car further away from work or school.  Go to a gym or an exercise class.  Start with 5 to 10 minutes of exercise each day. Build up to 30 minutes of exercise 4 to 6 days a week.  Wear shoes with good support and comfortable clothes.  Stretch before and after working out.  Work out until you breathe harder and your heart beats faster.  Drink extra water when you exercise.  Do not do so much that you hurt yourself, feel dizzy, or get very short of breath. Exercises that burn about 150 calories:  Running 1  miles in 15 minutes.  Playing volleyball for 45 to 60 minutes.  Washing and waxing a car for 45 to 60 minutes.  Playing touch football for 45 minutes.  Walking 1  miles in 35 minutes.  Pushing a stroller 1  miles in 30 minutes.  Playing basketball for 30 minutes.  Raking leaves for 30 minutes.  Bicycling 5 miles in 30 minutes.  Walking 2 miles in 30 minutes.  Dancing for 30 minutes.  Shoveling snow for 15 minutes.  Swimming laps for 20 minutes.  Walking up stairs for 15 minutes.  Bicycling 4 miles in 15 minutes.  Gardening for 30 to 45 minutes.  Jumping rope for 15 minutes.  Washing windows or floors for 45 to 60 minutes. Document Released: 01/23/2010 Document Revised: 03/15/2011 Document Reviewed: 01/23/2010 [employer] Patient Information 2015 [employer]. This information is not intended to replace advice given to you by your health care provider. Make sure you discuss any questions you have with your health care provider. Calorie Counting for Weight Loss Calories are energy you get from the things you eat and drink. Your  body uses this energy to keep you going throughout the day. The number of calories you eat affects your weight. When you eat more calories than your body needs, your body stores the extra calories as fat. When you eat fewer calories than your body needs, your body burns fat to get the energy it needs. Calorie counting means keeping track of how many calories you eat and drink each day. If you make sure to eat fewer calories than your body needs, you should lose weight. In order for calorie counting to work, you will need to eat the number of calories that are right for you in a day to lose a healthy amount of weight per week. A healthy amount of weight to lose per week is usually 1-2 lb (0.5-0.9 kg). A dietitian can determine how many calories you need in a day and give you suggestions on how to reach your calorie goal.  WHAT IS MY MY PLAN? My goal is to have __________ calories per day.  If I have this many calories per day, I should lose around __________ pounds per week. WHAT DO I NEED TO KNOW ABOUT CALORIE COUNTING? In order to meet your daily calorie goal, you will need to:  Find out how many calories are in each food you would like to eat. Try to do this before you eat.  Decide how much of the food you can eat.  Write down what you ate and   how many calories it had. Doing this is called keeping a food log. WHERE DO I FIND CALORIE INFORMATION? The number of calories in a food can be found on a Nutrition Facts label. Note that all the information on a label is based on a specific serving of the food. If a food does not have a Nutrition Facts label, try to look up the calories online or ask your dietitian for help. HOW DO I DECIDE HOW MUCH TO EAT? To decide how much of the food you can eat, you will need to consider both the number of calories in one serving and the size of one serving. This information can be found on the Nutrition Facts label. If a food does not have a Nutrition Facts label, look  up the information online or ask your dietitian for help. Remember that calories are listed per serving. If you choose to have more than one serving of a food, you will have to multiply the calories per serving by the amount of servings you plan to eat. For example, the label on a package of bread might say that a serving size is 1 slice and that there are 90 calories in a serving. If you eat 1 slice, you will have eaten 90 calories. If you eat 2 slices, you will have eaten 180 calories. HOW DO I KEEP A FOOD LOG? After each meal, record the following information in your food log:  What you ate.  How much of it you ate.  How many calories it had.  Then, add up your calories. Keep your food log near you, such as in a small notebook in your pocket. Another option is to use a mobile app or website. Some programs will calculate calories for you and show you how many calories you have left each time you add an item to the log. WHAT ARE SOME CALORIE COUNTING TIPS?  Use your calories on foods and drinks that will fill you up and not leave you hungry. Some examples of this include foods like nuts and nut butters, vegetables, lean proteins, and high-fiber foods (more than 5 g fiber per serving).  Eat nutritious foods and avoid empty calories. Empty calories are calories you get from foods or beverages that do not have many nutrients, such as candy and soda. It is better to have a nutritious high-calorie food (such as an avocado) than a food with few nutrients (such as a bag of chips).  Know how many calories are in the foods you eat most often. This way, you do not have to look up how many calories they have each time you eat them.  Look out for foods that may seem like low-calorie foods but are really high-calorie foods, such as baked goods, soda, and fat-free candy.  Pay attention to calories in drinks. Drinks such as sodas, specialty coffee drinks, alcohol, and juices have a lot of calories yet do  not fill you up. Choose low-calorie drinks like water and diet drinks.  Focus your calorie counting efforts on higher calorie items. Logging the calories in a garden salad that contains only vegetables is less important than calculating the calories in a milk shake.  Find a way of tracking calories that works for you. Get creative. Most people who are successful find ways to keep track of how much they eat in a day, even if they do not count every calorie. WHAT ARE SOME PORTION CONTROL TIPS?  Know how many calories are in a   serving. This will help you know how many servings of a certain food you can have.  Use a measuring cup to measure serving sizes. This is helpful when you start out. With time, you will be able to estimate serving sizes for some foods.  Take some time to put servings of different foods on your favorite plates, bowls, and cups so you know what a serving looks like.  Try not to eat straight from a bag or box. Doing this can lead to overeating. Put the amount you would like to eat in a cup or on a plate to make sure you are eating the right portion.  Use smaller plates, glasses, and bowls to prevent overeating. This is a quick and easy way to practice portion control. If your plate is smaller, less food can fit on it.  Try not to multitask while eating, such as watching TV or using your computer. If it is time to eat, sit down at a table and enjoy your food. Doing this will help you to start recognizing when you are full. It will also make you more aware of what and how much you are eating. HOW CAN I CALORIE COUNT WHEN EATING OUT?  Ask for smaller portion sizes or child-sized portions.  Consider sharing an entree and sides instead of getting your own entree.  If you get your own entree, eat only half. Ask for a box at the beginning of your meal and put the rest of your entree in it so you are not tempted to eat it.  Look for the calories on the menu. If calories are listed,  choose the lower calorie options.  Choose dishes that include vegetables, fruits, whole grains, low-fat dairy products, and lean protein. Focusing on smart food choices from each of the 5 food groups can help you stay on track at restaurants.  Choose items that are boiled, broiled, grilled, or steamed.  Choose water, milk, unsweetened iced tea, or other drinks without added sugars. If you want an alcoholic beverage, choose a lower calorie option. For example, a regular margarita can have up to 700 calories and a glass of wine has around 150.  Stay away from items that are buttered, battered, fried, or served with cream sauce. Items labeled "crispy" are usually fried, unless stated otherwise.  Ask for dressings, sauces, and syrups on the side. These are usually very high in calories, so do not eat much of them.  Watch out for salads. Many people think salads are a healthy option, but this is often not the case. Many salads come with bacon, fried chicken, lots of cheese, fried chips, and dressing. All of these items have a lot of calories. If you want a salad, choose a garden salad and ask for grilled meats or steak. Ask for the dressing on the side, or ask for olive oil and vinegar or lemon to use as dressing.  Estimate how many servings of a food you are given. For example, a serving of cooked rice is  cup or about the size of half a tennis ball or one cupcake wrapper. Knowing serving sizes will help you be aware of how much food you are eating at restaurants. The list below tells you how big or small some common portion sizes are based on everyday objects.  1 oz--4 stacked dice.  3 oz--1 deck of cards.  1 tsp--1 dice.  1 Tbsp-- a Ping-Pong ball.  2 Tbsp--1 Ping-Pong ball.   cup--1 tennis ball   or 1 cupcake wrapper.  1 cup--1 baseball. Document Released: 12/21/2004 Document Revised: 05/07/2013 Document Reviewed: 10/26/2012 [facility] Patient Information 2015 [place], [place]. This  information is not intended to replace advice given to you by your health care provider. Make sure you discuss any questions you have with your health care provider. Intrauterine Device Insertion Most often, an intrauterine device (IUD) is inserted into the uterus to prevent pregnancy. There are 2 types of IUDs available:  Copper IUD--This type of IUD creates an environment that is not favorable to sperm survival. The mechanism of action of the copper IUD is not known for certain. It can stay in place for 10 years.  Hormone IUD--This type of IUD contains the hormone progestin (synthetic progesterone). The progestin thickens the cervical mucus and prevents sperm from entering the uterus, and it also thins the uterine lining. There is no evidence that the hormone IUD prevents implantation. One hormone IUD can stay in place for up to 5 years, and a different hormone IUD can stay in place for up to 3 years. An IUD is the most cost-effective birth control if left in place for the full duration. It may be removed at any time. LET [facility] CARE PROVIDER KNOW ABOUT:  Any allergies you have.  All medicines you are taking, including vitamins, herbs, eye drops, creams, and over-the-counter medicines.  Previous problems you or members of your family have had with the use of anesthetics.  Any blood disorders you have.  Previous surgeries you have had.  Possibility of pregnancy.  Medical conditions you have. RISKS AND COMPLICATIONS  Generally, intrauterine device insertion is a safe procedure. However, as with any procedure, complications can occur. Possible complications include:  Accidental puncture (perforation) of the uterus.  Accidental placement of the IUD either in the muscle layer of the uterus (myometrium) or outside the uterus. If this happens, the IUD can be found essentially floating around the bowels and must be taken out surgically.  The IUD may fall out of the uterus (expulsion).  This is more common in women who have recently had a child.   Pregnancy in the fallopian tube (ectopic).  Pelvic inflammatory disease (PID), which is infection of the uterus and fallopian tubes. The risk of PID is slightly increased in the first 20 days after the IUD is placed, but the overall risk is still very low. BEFORE THE PROCEDURE  Schedule the IUD insertion for when you will have your menstrual period or right after, to make sure you are not pregnant. Placement of the IUD is better tolerated shortly after a menstrual cycle.  You may need to take tests or be examined to make sure you are not pregnant.  You may be required to take a pregnancy test.  You may be required to get checked for sexually transmitted infections (STIs) prior to placement. Placing an IUD in someone who has an infection can make the infection worse.  You may be given a pain reliever to take 1 or 2 hours before the procedure.  An exam will be performed to determine the size and position of your uterus.  Ask your health care provider about changing or stopping your regular medicines. PROCEDURE   A tool (speculum) is placed in the vagina. This allows your health care provider to see the lower part of the uterus (cervix).  The cervix is prepped with a medicine that lowers the risk of infection.  You may be given a medicine to numb each side of the  cervix (intracervical or paracervical block). This is used to block and control any discomfort with insertion.  A tool (uterine sound) is inserted into the uterus to determine the length of the uterine cavity and the direction the uterus may be tilted.  A slim instrument (IUD inserter) is inserted through the cervical canal and into your uterus.  The IUD is placed in the uterine cavity and the insertion device is removed.  The nylon string that is attached to the IUD and used for eventual IUD removal is trimmed. It is trimmed so that it lays high in the vagina,  just outside the cervix. AFTER THE PROCEDURE  You may have bleeding after the procedure. This is normal. It varies from light spotting for a few days to menstrual-like bleeding.  You may have mild cramping. Document Released: 08/19/2010 Document Revised: 10/11/2012 Document Reviewed: 06/11/2012 [facility] Patient Information 2015 [place], [place]. This information is not intended to replace advice given to you by your health care provider. Make sure you discuss any questions you have with your health care provider.

## 2013-09-13 LAB — RPR

## 2013-09-13 LAB — HIV ANTIBODY (ROUTINE TESTING W REFLEX): HIV: NONREACTIVE

## 2013-09-14 LAB — PAP IG, CT-NG, RFX HPV ASCU
Chlamydia Probe Amp: NEGATIVE
GC Probe Amp: NEGATIVE

## 2013-09-26 ENCOUNTER — Encounter: Payer: Self-pay | Admitting: *Deleted

## 2013-09-26 ENCOUNTER — Encounter: Payer: BC Managed Care – PPO | Attending: Obstetrics & Gynecology | Admitting: *Deleted

## 2013-09-26 VITALS — Ht 65.0 in | Wt 270.1 lb

## 2013-09-26 DIAGNOSIS — Z713 Dietary counseling and surveillance: Secondary | ICD-10-CM | POA: Diagnosis not present

## 2013-09-26 DIAGNOSIS — E669 Obesity, unspecified: Secondary | ICD-10-CM | POA: Diagnosis not present

## 2013-09-26 DIAGNOSIS — Z6841 Body Mass Index (BMI) 40.0 and over, adult: Secondary | ICD-10-CM | POA: Diagnosis not present

## 2013-09-26 NOTE — Progress Notes (Signed)
Medical Nutrition Therapy:  Appt start time: 1115 end time:  1215.  Assessment:  Patient here today for weight management. 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 She has since maintained her weight at 270 pounds for the last few years. She works in the [employer], so she receives lunches at school. Her intake is fairly consistent day to day. Portion control is good, but she drinks at least 5 cups of milk daily and adds a lot of fats to foods.   She has started exercising, using a run/walk app on her phone.   Patient is motivated to lose weight due to a strong family history of diabetes  MEDICATIONS: See list   DIETARY INTAKE:   Usual eating pattern includes 3 meals and 1 snacks per day.  24-hr recall:  B ( AM): 1.5 cups cereal (cinnamon toast crunch), 2% milk OR eggs, bacon, hash browns, biscuits on Sundays, 8 oz milk Snk ( AM): None  L ( PM): School lunch; popcorn chicken, baked fries, milk Snk ( PM): Reese cups, milk; 3 days a week D ( PM): Deer tenderloin, 2 eggs, biscuit OR baked pork chop/chicken, baked potatoes/fries, green beans/broccoli/salad Snk ( PM): Milk Beverages: Milk, water  Usual physical activity: Walking/running 30 minutes 3 days weekly  Estimated energy needs: 1500 calories 188 g carbohydrates 94 g protein 42 g fat  Progress Towards Goal(s):  In progress.   Nutritional Diagnosis:  -3.3 Overweight/obesity As related to history of excessive energy intake.  As evidenced by BMI >30.    Intervention:  Nutrition counseling. We discussed strategies for weight loss, including balancing nutrients (carbs, protein, fat), portion control, healthy snacks, and exercise.   Goals:  1. 1-2 pounds weight loss per week. Short term goal weight <250 pounds in 6 months.  2. Use plate method for meal planning, increasing intake of non-starchy vegetables.  3. Monitor portion size of foods.  4. Switch to unsweetened  cereal.  5. 1-2 snacks daily as needed, choosing fruit, yogurt, vegetables, nuts vs. Less healthy foods.  6. Limit intake of milk to 3 cups daily. 7. Limit fat to 2 servings (2 tsp) per meal 8. 30-45 minutes of walking/running 5 days weekly  Handouts given during visit include:  Weight loss tips  5 day meal plan  Meal plan card  Monitoring/Evaluation:  Dietary intake, exercise, and body weight prn.

## 2013-10-08 ENCOUNTER — Ambulatory Visit: Payer: BC Managed Care – PPO | Admitting: Obstetrics & Gynecology

## 2013-10-12 ENCOUNTER — Other Ambulatory Visit: Payer: Self-pay | Admitting: *Deleted

## 2013-10-12 DIAGNOSIS — N760 Acute vaginitis: Principal | ICD-10-CM

## 2013-10-12 DIAGNOSIS — B9689 Other specified bacterial agents as the cause of diseases classified elsewhere: Secondary | ICD-10-CM

## 2013-10-12 MED ORDER — METRONIDAZOLE 500 MG PO TABS: 500.0000 mg | ORAL_TABLET | Freq: Two times a day (BID) | ORAL | Status: DC

## 2013-11-02 ENCOUNTER — Ambulatory Visit: Payer: BC Managed Care – PPO | Admitting: Obstetrics & Gynecology

## 2013-12-31 ENCOUNTER — Encounter: Payer: Self-pay | Admitting: *Deleted

## 2014-01-01 ENCOUNTER — Encounter: Payer: Self-pay | Admitting: Obstetrics & Gynecology

## 2014-04-26 ENCOUNTER — Ambulatory Visit (INDEPENDENT_AMBULATORY_CARE_PROVIDER_SITE_OTHER): Payer: BLUE CROSS/BLUE SHIELD | Admitting: Certified Nurse Midwife

## 2014-04-26 ENCOUNTER — Encounter: Payer: Self-pay | Admitting: Certified Nurse Midwife

## 2014-04-26 VITALS — BP 120/82 | HR 102 | Temp 98.1°F

## 2014-04-26 DIAGNOSIS — Z3169 Encounter for other general counseling and advice on procreation: Secondary | ICD-10-CM | POA: Diagnosis not present

## 2014-04-26 DIAGNOSIS — E282 Polycystic ovarian syndrome: Secondary | ICD-10-CM

## 2014-04-26 MED ORDER — METFORMIN HCL 500 MG PO TABS: 500.0000 mg | ORAL_TABLET | Freq: Two times a day (BID) | ORAL | Status: DC

## 2014-04-26 MED ORDER — OB COMPLETE GOLD 27.5-1-200 MG PO CAPS: 1.0000 | ORAL_CAPSULE | Freq: Every day | ORAL | Status: DC

## 2014-04-26 NOTE — Progress Notes (Signed)
Patient ID: Diana Bowman, female   DOB: [birth year], 24 y.o.   MRN: [id number]   Chief Complaint  Patient presents with  . Advice Only    conception    HPI Diana HaywardCynthia G Bowman is a 24 y.o. female.  C/O irregular periods and is trying to get pregnant since January.  She reports no periods since January.  Has lost some weight.  Has been to nutrition.  Is exercising more.  Is not currently taking vitamins.  Preconception counseling given.  Denies any chronic medical conditions.  Denies any family hx of fetal anomalies.  Employed full time with [employer].      HPI  No past medical history on file.  No past surgical history on file.  Family History  Problem Relation Age of Onset  . Diabetes Maternal Grandmother   . Heart attack Maternal Grandfather   . Kidney disease Paternal Grandfather   . Diabetes Paternal Grandfather   . Diabetes Maternal Aunt   . Thrombosis Father     Social History History  Substance Use Topics  . Smoking status: Never Smoker   . Smokeless tobacco: Not on file  . Alcohol Use: Yes     Comment: occasional    No Known Allergies  Current Outpatient Prescriptions  Medication Sig Dispense Refill  . metFORMIN (GLUCOPHAGE) 500 MG tablet Take 1 tablet (500 mg total) by mouth 2 (two) times daily with a meal. 60 tablet 11  . Prenat w/o A-FeCbn-Meth-FA-DHA (OB COMPLETE GOLD) 27.5-1-200 MG CAPS Take 1 capsule by mouth daily. 30 capsule 12   No current facility-administered medications for this visit.    Review of Systems Review of Systems Constitutional: negative for fatigue and weight loss Respiratory: negative for cough and wheezing Cardiovascular: negative for chest pain, fatigue and palpitations Gastrointestinal: negative for abdominal pain and change in bowel habits Genitourinary:negative Integument/breast: negative for nipple discharge Musculoskeletal:negative for myalgias Neurological: negative for gait problems and  tremors Behavioral/Psych: negative for abusive relationship, depression Endocrine: negative for temperature intolerance     Blood pressure 120/82, pulse 102, temperature 98.1 F (36.7 C).  Physical Exam Physical Exam General:   alert  Skin:   no rash or abnormalities  Lungs:   clear to auscultation bilaterally  Heart:   regular rate and rhythm, S1, S2 normal, no murmur, click, rub or gallop  Breasts:   deferred  Abdomen:  normal findings: no organomegaly, soft, non-tender and no hernia.  Difficult to assess d/t body habitus  Pelvis:  deferred    90% of 15 min visit spent on counseling and coordination of care.   Data Reviewed Previous medical hx, labs, medications  Assessment     PCOS     Plan    Orders Placed This Encounter  Procedures  . US Transvaginal Non-OB    Standing Status: Future     Number of Occurrences:      Standing Expiration Date: 06/26/2015    Order Specific Question:  Reason for Exam (SYMPTOM  OR DIAGNOSIS REQUIRED)    Answer:  PCOS    Order Specific Question:  Preferred imaging [facility]?    [facility]  . Hemoglobin A1c  . Basic Metabolic Panel (BMET)   Meds ordered this encounter  Medications  . metFORMIN (GLUCOPHAGE) 500 MG tablet    Sig: Take 1 tablet (500 mg total) by mouth 2 (two) times daily with a meal.    Dispense:  60 tablet    Refill:  11  .  Prenat w/o A-FeCbn-Meth-FA-DHA (OB COMPLETE GOLD) 27.5-1-200 MG CAPS    Sig: Take 1 capsule by mouth daily.    Dispense:  30 capsule    Refill:  12    Preconception counseling given.  Possible management options include: Infertility specialist referral.  Return appointment for 2 months.

## 2014-04-27 LAB — BASIC METABOLIC PANEL
BUN: 12 mg/dL (ref 6–23)
CHLORIDE: 105 meq/L (ref 96–112)
CO2: 25 meq/L (ref 19–32)
CREATININE: 0.76 mg/dL (ref 0.50–1.10)
Calcium: 9.8 mg/dL (ref 8.4–10.5)
Glucose, Bld: 88 mg/dL (ref 70–99)
Potassium: 4.5 mEq/L (ref 3.5–5.3)
Sodium: 141 mEq/L (ref 135–145)

## 2014-04-27 LAB — HEMOGLOBIN A1C
Hgb A1c MFr Bld: 5.5 % (ref <5.7)
MEAN PLASMA GLUCOSE: 111 mg/dL (ref <117)

## 2014-04-29 ENCOUNTER — Telehealth: Payer: Self-pay

## 2014-04-29 NOTE — Telephone Encounter (Signed)
gave patient appt date and time [facility]/07/14 at 10am

## 2014-05-07 ENCOUNTER — Other Ambulatory Visit: Payer: Self-pay | Admitting: Certified Nurse Midwife

## 2014-05-07 ENCOUNTER — Ambulatory Visit (HOSPITAL_COMMUNITY)
Admission: RE | Admit: 2014-05-07 | Discharge: 2014-05-07 | Disposition: A | Discharge status: Home / Self Care | Payer: BLUE CROSS/BLUE SHIELD | Source: Ambulatory Visit | Attending: Certified Nurse Midwife | Admitting: Certified Nurse Midwife

## 2014-05-07 DIAGNOSIS — E282 Polycystic ovarian syndrome: Secondary | ICD-10-CM

## 2014-05-07 DIAGNOSIS — N979 Female infertility, unspecified: Secondary | ICD-10-CM | POA: Insufficient documentation

## 2014-05-07 DIAGNOSIS — N921 Excessive and frequent menstruation with irregular cycle: Secondary | ICD-10-CM | POA: Insufficient documentation

## 2014-06-07 ENCOUNTER — Other Ambulatory Visit: Payer: Self-pay | Admitting: Certified Nurse Midwife

## 2014-06-07 ENCOUNTER — Telehealth: Payer: Self-pay | Admitting: *Deleted

## 2014-06-07 DIAGNOSIS — E669 Obesity, unspecified: Secondary | ICD-10-CM

## 2014-06-07 NOTE — Telephone Encounter (Signed)
Reviewed test results with patient. Patient is interested in getting pregnant. Per Orvilla Cornwallachelle Denney, CNM the following advice was given to the patient. Patient to continue her prenatal vitamins, continue metformin and to start a low dose aspirin daily. Patient to possibly see a nutritionist. Patient to have spouse increase the amount of chicken and oysters in his diet, not wear tight underwear and not to masturbate. Patient advised to have sex every 2-3 days. Patient advised to try for 6 months actively and if unsuccessful to make an appointment to discuss the next step. Patient verbalized understanding and agreed with the plan.

## 2014-08-15 ENCOUNTER — Ambulatory Visit: Payer: BLUE CROSS/BLUE SHIELD | Admitting: Dietician

## 2015-06-04 ENCOUNTER — Ambulatory Visit: Payer: BLUE CROSS/BLUE SHIELD | Admitting: Certified Nurse Midwife

## 2016-04-15 ENCOUNTER — Ambulatory Visit: Payer: BLUE CROSS/BLUE SHIELD | Admitting: Obstetrics & Gynecology

## 2016-05-28 ENCOUNTER — Emergency Department (HOSPITAL_COMMUNITY)
Admission: EM | Admit: 2016-05-28 | Discharge: 2016-05-28 | Disposition: A | Discharge status: Home / Self Care | Payer: BC Managed Care – PPO | Attending: Emergency Medicine | Admitting: Emergency Medicine

## 2016-05-28 ENCOUNTER — Encounter (HOSPITAL_COMMUNITY): Payer: Self-pay | Admitting: Emergency Medicine

## 2016-05-28 ENCOUNTER — Emergency Department (HOSPITAL_COMMUNITY): Payer: BC Managed Care – PPO

## 2016-05-28 DIAGNOSIS — R0789 Other chest pain: Secondary | ICD-10-CM

## 2016-05-28 DIAGNOSIS — R079 Chest pain, unspecified: Secondary | ICD-10-CM | POA: Diagnosis present

## 2016-05-28 LAB — BASIC METABOLIC PANEL
ANION GAP: 8 (ref 5–15)
BUN: 12 mg/dL (ref 6–20)
CALCIUM: 8.9 mg/dL (ref 8.9–10.3)
CO2: 21 mmol/L — AB (ref 22–32)
Chloride: 111 mmol/L (ref 101–111)
Creatinine, Ser: 0.84 mg/dL (ref 0.44–1.00)
GFR calc Af Amer: >60 mL/min (ref >60)
Glucose, Bld: 105 mg/dL — ABNORMAL HIGH (ref 65–99)
Potassium: 3.7 mmol/L (ref 3.5–5.1)
Sodium: 140 mmol/L (ref 135–145)

## 2016-05-28 LAB — CBC
HEMATOCRIT: 46.5 % — AB (ref 36.0–46.0)
HEMOGLOBIN: 15.7 g/dL — AB (ref 12.0–15.0)
MCH: 30.3 pg (ref 26.0–34.0)
MCHC: 33.8 g/dL (ref 30.0–36.0)
MCV: 89.6 fL (ref 78.0–100.0)
Platelets: 267 10*3/uL (ref 150–400)
RBC: 5.19 MIL/uL — ABNORMAL HIGH (ref 3.87–5.11)
RDW: 14 % (ref 11.5–15.5)
WBC: 13 10*3/uL — ABNORMAL HIGH (ref 4.0–10.5)

## 2016-05-28 LAB — D-DIMER, QUANTITATIVE: D-Dimer, Quant: <0.27 ug/mL-FEU (ref 0.00–0.50)

## 2016-05-28 LAB — I-STAT TROPONIN, ED: TROPONIN I, POC: 0.01 ng/mL (ref 0.00–0.08)

## 2016-05-28 MED ORDER — SODIUM CHLORIDE 0.9 % IV BOLUS (SEPSIS)
1000.0000 mL | Freq: Once | INTRAVENOUS | Status: AC
  Administered 2016-05-28: 1000 mL via INTRAVENOUS

## 2016-05-28 MED ORDER — KETOROLAC TROMETHAMINE 30 MG/ML IJ SOLN
30.0000 mg | Freq: Once | INTRAMUSCULAR | Status: AC
  Administered 2016-05-28: 30 mg via INTRAVENOUS
  Filled 2016-05-28: qty 1

## 2016-05-28 NOTE — ED Provider Notes (Signed)
[facility]-EMERGENCY DEPT Provider Note   CSN: [id number] Arrival date & time: 05/28/16  4098     History   Chief Complaint Chief Complaint  Patient presents with  . Chest Pain    HPI Diana Bowman is a 26 y.o. female.  HPI  26 year old female with no significant past medical history presents with chest pain and shortness of breath. She's been feeling this way on and off for "a few weeks". However today seemed significantly worse. Patient states the chest pain comes and goes and feels sharp, squeezing, and pressure. Is in her left chest and radiates up into her neck. There is associated shortness of breath. This morning things seemed worse and then when she took a shower she thinks steam ate everything worse. She got into an episode of difficulty breathing and then vomited when she was trying to catch her breath. She's had a cough since yesterday. No leg swelling, recent travel, or history or familial history of DVT or PE. She does not smoke. She denies any significant past medical history. She states she was seen by her doctor 10 days ago and has a follow-up next month due to "an extremely high WBC" (Care everywhere shows this to be 16). Chest pain is not positional.Tylenol has not helped. Currently on her period.  History reviewed. No pertinent past medical history.  Patient Active Problem List   Diagnosis Date Noted  . Lump or mass in breast 08/22/2012  . ASCUS (atypical squamous cells of undetermined significance) on Pap smear 04/11/2012  . Irregular menstrual cycle 04/06/2012  . 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 No pertinent surgical history.  OB History    No data available       Home Medications    Prior to Admission medications   Medication Sig Start Date End Date Taking? Authorizing Provider  metFORMIN (GLUCOPHAGE-XR) 500 MG 24 hr tablet Take 500 mg by mouth every evening. 05/18/16 05/18/17 Yes [provider]    rizatriptan (MAXALT) 10 MG tablet Take 10 mg by mouth daily as needed for migraine. 05/09/16  Yes [provider]  topiramate (TOPAMAX) 25 MG tablet Take 50 mg by mouth 2 (two) times daily. Start by taking 1 tablet at bedtime once daily. Adjust to taking 2 tablets twice daily. 03/17/16  Yes [provider]  triamcinolone cream (KENALOG) 0.1 % Apply 1 application topically as needed for itching. 03/19/16  Yes [provider]  Prenat w/o A-FeCbn-Meth-FA-DHA (OB COMPLETE GOLD) 27.5-1-200 MG CAPS Take 1 capsule by mouth daily. Patient not taking: Reported on 05/28/2016 04/26/14   Roe Coombs, CNM    Family History Family History  Problem Relation Age of Onset  . Diabetes Maternal Grandmother   . Heart attack Maternal Grandfather   . Kidney disease Paternal Grandfather   . Diabetes Paternal Grandfather   . Diabetes Maternal Aunt   . Thrombosis Father     Social History Social History  Substance Use Topics  . Smoking status: Never Smoker  . Smokeless tobacco: Never Used  . Alcohol use Yes     Comment: occasional     Allergies   Patient has no known allergies.   Review of Systems Review of Systems  Constitutional: Negative for fever.  Respiratory: Positive for cough and shortness of breath.   Cardiovascular: Positive for chest pain. Negative for leg swelling.  Gastrointestinal: Negative for abdominal pain.  Genitourinary: Negative for menstrual problem (currently on her period).  All other systems  reviewed and are negative.    Physical Exam Updated Vital Signs BP 134/79   Pulse 97   Temp 98.2 F (36.8 C) (Oral)   Resp (!) 21   Ht 5\' 5"  (1.651 m)   Wt (!) 139.7 kg (308 lb)   LMP 05/21/2016   SpO2 98%   BMI 51.25 kg/m   Physical Exam  Constitutional: She is oriented to person, place, and time. She appears well-developed and well-nourished. No distress.  Morbidly obese  HENT:  Head: Normocephalic and atraumatic.  Right Ear: External  ear normal.  Left Ear: External ear normal.  Nose: Nose normal.  Eyes: Right eye exhibits no discharge. Left eye exhibits no discharge.  Cardiovascular: Normal rate, regular rhythm and normal heart sounds.   Pulmonary/Chest: Effort normal and breath sounds normal. No tachypnea. She has no wheezes. She exhibits tenderness.    Speaks in complete sentences without difficulty  Abdominal: Soft. There is no tenderness.  Musculoskeletal: She exhibits no edema.  Neurological: She is alert and oriented to person, place, and time.  Skin: Skin is warm and dry. She is not diaphoretic.  Nursing note and vitals reviewed.    ED Treatments / Results  Labs (all labs ordered are listed, but only abnormal results are displayed) Labs Reviewed  BASIC METABOLIC PANEL - Abnormal; Notable for the following:       Result Value   CO2 21 (*)    Glucose, Bld 105 (*)    All other components within normal limits  CBC - Abnormal; Notable for the following:    WBC 13.0 (*)    RBC 5.19 (*)    Hemoglobin 15.7 (*)    HCT 46.5 (*)    All other components within normal limits  D-DIMER, QUANTITATIVE (NOT AT [facility])  I-STAT TROPOININ, ED    EKG  EKG Interpretation  Date/Time:  Friday May 28 2016 07:07:11 EDT Ventricular Rate:  114 PR Interval:    QRS Duration: 87 QT Interval:  323 QTC Calculation: 445 R Axis:   7 Text Interpretation:  Sinus tachycardia Low voltage, precordial leads No old tracing to compare Reconfirmed by Pricilla Loveless [phone]) on 05/28/2016 7:14:54 AM Also confirmed by Pricilla Loveless [phone]), editor Elita Quick [id number])  on 05/28/2016 8:49:51 AM       Radiology Dg Chest 2 View  Result Date: 05/28/2016 CLINICAL DATA:  Chest pain. EXAM: CHEST  2 VIEW COMPARISON:  01/05/2012 . FINDINGS: Mediastinum and hilar structures normal. Heart size normal . Low lung volumes with mild stable basilar right upper lobe pleural-parenchymal changes of scarring. No pleural effusion or pneumothorax.  IMPRESSION: No acute cardiopulmonary disease. Electronically Signed   By: Maisie Fus  Register   On: 05/28/2016 07:42    Procedures Procedures (including critical care time)  Medications Ordered in ED Medications  ketorolac (TORADOL) 30 MG/ML injection 30 mg (30 mg Intravenous Given 05/28/16 0842)  sodium chloride 0.9 % bolus 1,000 mL (1,000 mLs Intravenous New Bag/Given 05/28/16 0842)     Initial Impression / Assessment and Plan / ED Course  I have reviewed the triage vital signs and the nursing notes.  Pertinent labs & imaging results that were available during my care of the patient were reviewed by me and considered in my medical decision making (see chart for details).     Patient's chest pain is probably chest wall in etiology. I have low suspicion this is ACS, PE, or dissection. She was tachycardic on arrival and so a d-dimer was sent but is  negative. Heart rate has improved. She states the IV Toradol has significantly relieved her pain and she has essentially no pain now. There is no bronchospasm on respiratory exam. At this point, I feel she is stable for discharge home. I have discussed using ibuprofen as well as Tylenol for pain. Follow-up with her PCP. Discussed return precautions.  Final Clinical Impressions(s) / ED Diagnoses   Final diagnoses:  Chest wall pain    New Prescriptions New Prescriptions   No medications on file     Pricilla LovelessGoldston, Yaneliz Radebaugh, MD 05/28/16 [phone]

## 2016-05-28 NOTE — ED Triage Notes (Signed)
Patient c/o left sided chest pain that radiates up neck to back that has been recurrent over past couple weeks.  Patient reports this morning she had SOB, dizziness, lightheadedness, n/v with chest pain.

## 2016-12-21 ENCOUNTER — Emergency Department (HOSPITAL_COMMUNITY)
Admission: EM | Admit: 2016-12-21 | Discharge: 2016-12-21 | Disposition: A | Discharge status: Home / Self Care | Payer: BC Managed Care – PPO | Attending: Emergency Medicine | Admitting: Emergency Medicine

## 2016-12-21 ENCOUNTER — Other Ambulatory Visit: Payer: Self-pay

## 2016-12-21 ENCOUNTER — Encounter (HOSPITAL_COMMUNITY): Payer: Self-pay | Admitting: *Deleted

## 2016-12-21 DIAGNOSIS — K529 Noninfective gastroenteritis and colitis, unspecified: Secondary | ICD-10-CM | POA: Diagnosis not present

## 2016-12-21 DIAGNOSIS — Z79899 Other long term (current) drug therapy: Secondary | ICD-10-CM | POA: Diagnosis not present

## 2016-12-21 DIAGNOSIS — R1110 Vomiting, unspecified: Secondary | ICD-10-CM | POA: Diagnosis present

## 2016-12-21 HISTORY — DX: Unspecified ovarian cyst, right side: N83.201

## 2016-12-21 HISTORY — DX: Unspecified ovarian cyst, left side: N83.202

## 2016-12-21 LAB — COMPREHENSIVE METABOLIC PANEL
ALT: 46 U/L (ref 14–54)
AST: 30 U/L (ref 15–41)
Albumin: 3.8 g/dL (ref 3.5–5.0)
Alkaline Phosphatase: 72 U/L (ref 38–126)
Anion gap: 13 (ref 5–15)
BUN: 12 mg/dL (ref 6–20)
CALCIUM: 8.6 mg/dL — AB (ref 8.9–10.3)
CO2: 22 mmol/L (ref 22–32)
CREATININE: 0.71 mg/dL (ref 0.44–1.00)
Chloride: 102 mmol/L (ref 101–111)
Glucose, Bld: 136 mg/dL — ABNORMAL HIGH (ref 65–99)
Potassium: 3.6 mmol/L (ref 3.5–5.1)
Sodium: 137 mmol/L (ref 135–145)
Total Bilirubin: 0.8 mg/dL (ref 0.3–1.2)
Total Protein: 7.5 g/dL (ref 6.5–8.1)

## 2016-12-21 LAB — CBC
HCT: 47.9 % — ABNORMAL HIGH (ref 36.0–46.0)
Hemoglobin: 15.7 g/dL — ABNORMAL HIGH (ref 12.0–15.0)
MCH: 30.4 pg (ref 26.0–34.0)
MCHC: 32.8 g/dL (ref 30.0–36.0)
MCV: 92.8 fL (ref 78.0–100.0)
PLATELETS: 284 10*3/uL (ref 150–400)
RBC: 5.16 MIL/uL — AB (ref 3.87–5.11)
RDW: 13.8 % (ref 11.5–15.5)
WBC: 18.3 10*3/uL — AB (ref 4.0–10.5)

## 2016-12-21 LAB — I-STAT BETA HCG BLOOD, ED (MC, WL, AP ONLY)

## 2016-12-21 MED ORDER — KETOROLAC TROMETHAMINE 30 MG/ML IJ SOLN
30.0000 mg | Freq: Once | INTRAMUSCULAR | Status: AC
  Administered 2016-12-21: 30 mg via INTRAVENOUS
  Filled 2016-12-21: qty 1

## 2016-12-21 MED ORDER — ONDANSETRON HCL 4 MG/2ML IJ SOLN
4.0000 mg | Freq: Once | INTRAMUSCULAR | Status: AC
  Administered 2016-12-21: 4 mg via INTRAVENOUS
  Filled 2016-12-21: qty 2

## 2016-12-21 MED ORDER — ONDANSETRON 8 MG PO TBDP: ORAL_TABLET | ORAL | 0 refills | Status: DC

## 2016-12-21 MED ORDER — SODIUM CHLORIDE 0.9 % IV BOLUS (SEPSIS)
1000.0000 mL | Freq: Once | INTRAVENOUS | Status: AC
  Administered 2016-12-21: 1000 mL via INTRAVENOUS

## 2016-12-21 NOTE — ED Triage Notes (Signed)
Pt c/o n/v/d with fever since yesterday

## 2016-12-21 NOTE — Discharge Instructions (Signed)
Zofran as prescribed as needed for nausea.  Clear liquid diet the next 12 hours, then slowly advance to normal as tolerated.  Return to the emergency department if you develop severe abdominal pain, high fevers, bloody stools, or other new and concerning symptoms.

## 2016-12-21 NOTE — ED Provider Notes (Signed)
  Physical Exam  BP 137/84 (BP Location: Left Arm)   Pulse (!) 122 Comment: RN aware.  Temp 100 F (37.8 C) (Oral)   Resp 20   Ht 5\' 5"  (1.651 m)   Wt (!) 142.9 kg (315 lb)   SpO2 94%   BMI 52.42 kg/m   Physical Exam  ED Course/Procedures     Procedures  MDM  Patient feels better after treatment.  Will discharge home       Benjiman CorePickering, Jemario Poitras, MD 12/21/16 [phone]

## 2016-12-21 NOTE — ED Notes (Signed)
ED Provider at bedside. 

## 2016-12-21 NOTE — ED Provider Notes (Signed)
[facility] EMERGENCY DEPARTMENT Provider Note   CSN: [id number] Arrival date & time: 12/21/16  0557     History   Chief Complaint Chief Complaint  Patient presents with  . Emesis    HPI Diana Bowman is a 26 y.o. female.  Patient is a 26 year old female with history of obesity and ovarian cyst.  She presents for valuation of nausea, vomiting, diarrhea that started yesterday.  She reports multiple episodes of both.  She reports low-grade fevers.  She denies any ill contacts.   The history is provided by the patient.  Emesis   This is a new problem. The current episode started yesterday. The problem occurs 2 to 4 times per day. The problem has not changed since onset.The emesis has an appearance of stomach contents. The maximum temperature recorded prior to her arrival was 100 to 100.9 F. The fever has been present for less than 1 day. Associated symptoms include diarrhea and a fever. Pertinent negatives include no abdominal pain and no chills.    Past Medical History:  Diagnosis Date  . Bilateral ovarian cysts     Patient Active Problem List   Diagnosis Date Noted  . Lump or mass in breast 08/22/2012  . ASCUS (atypical squamous cells of undetermined significance) on Pap smear 04/11/2012  . Irregular menstrual cycle 04/06/2012  . 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 No pertinent surgical history.  OB History    No data available       Home Medications    Prior to Admission medications   Medication Sig Start Date End Date Taking? Authorizing Provider  metFORMIN (GLUCOPHAGE-XR) 500 MG 24 hr tablet Take 500 mg by mouth every evening. 05/18/16 05/18/17  [provider]  Prenat w/o A-FeCbn-Meth-FA-DHA (OB COMPLETE GOLD) 27.5-1-200 MG CAPS Take 1 capsule by mouth daily. Patient not taking: Reported on 05/28/2016 04/26/14   Orvilla Cornwallenney, Rachelle A, CNM  rizatriptan (MAXALT) 10 MG tablet Take 10 mg by mouth daily as needed  for migraine. 05/09/16   [provider]  topiramate (TOPAMAX) 25 MG tablet Take 50 mg by mouth 2 (two) times daily. Start by taking 1 tablet at bedtime once daily. Adjust to taking 2 tablets twice daily. 03/17/16   [provider]  triamcinolone cream (KENALOG) 0.1 % Apply 1 application topically as needed for itching. 03/19/16   [provider]    Family History Family History  Problem Relation Age of Onset  . Diabetes Maternal Grandmother   . Heart attack Maternal Grandfather   . Kidney disease Paternal Grandfather   . Diabetes Paternal Grandfather   . Diabetes Maternal Aunt   . Thrombosis Father     Social History Social History   Tobacco Use  . Smoking status: Never Smoker  . Smokeless tobacco: Never Used  Substance Use Topics  . Alcohol use: No    Frequency: Never  . Drug use: No     Allergies   Patient has no known allergies.   Review of Systems Review of Systems  Constitutional: Positive for fever. Negative for chills.  Gastrointestinal: Positive for diarrhea and vomiting. Negative for abdominal pain.  All other systems reviewed and are negative.    Physical Exam Updated Vital Signs BP 137/84 (BP Location: Left Arm)   Pulse (!) 122 Comment: RN aware.  Temp 100 F (37.8 C) (Oral)   Resp 20   Ht 5\' 5"  (1.651 m)   Wt (!) 142.9 kg (315 lb)  SpO2 94%   BMI 52.42 kg/m   Physical Exam  Constitutional: She is oriented to person, place, and time. She appears well-developed and well-nourished. No distress.  HENT:  Head: Normocephalic and atraumatic.  Mouth/Throat: Oropharynx is clear and moist.  Neck: Normal range of motion. Neck supple.  Cardiovascular: Normal rate and regular rhythm. Exam reveals no gallop and no friction rub.  No murmur heard. Pulmonary/Chest: Effort normal and breath sounds normal. No respiratory distress. She has no wheezes.  Abdominal: Soft. Bowel sounds are normal. She exhibits no distension. There is no  tenderness.  Musculoskeletal: Normal range of motion.  Neurological: She is alert and oriented to person, place, and time.  Skin: Skin is warm and dry. She is not diaphoretic.  Nursing note and vitals reviewed.    ED Treatments / Results  Labs (all labs ordered are listed, but only abnormal results are displayed) Labs Reviewed  LIPASE, BLOOD  COMPREHENSIVE METABOLIC PANEL  CBC  URINALYSIS, ROUTINE W REFLEX MICROSCOPIC  I-STAT BETA HCG BLOOD, ED ([facility], [facility], [facility] ONLY)  I-STAT BETA HCG BLOOD, ED ([facility], [facility], [facility] ONLY)    EKG  EKG Interpretation None       Radiology No results found.  Procedures Procedures (including critical care time)  Medications Ordered in ED Medications  sodium chloride 0.9 % bolus 1,000 mL (not administered)  ondansetron (ZOFRAN) injection 4 mg (not administered)  ketorolac (TORADOL) 30 MG/ML injection 30 mg (not administered)     Initial Impression / Assessment and Plan / ED Course  I have reviewed the triage vital signs and the nursing notes.  Pertinent labs & imaging results that were available during my care of the patient were reviewed by me and considered in my medical decision making (see chart for details).  Patient's presentation, exam, and laboratory studies are most consistent with a viral gastroenteritis.  She is feeling better after IV fluids, Zofran, and Toradol.  She appears well-hydrated and her abdominal exam is benign.  There is no tenderness.  She will be discharged with Zofran and as needed return.  Final Clinical Impressions(s) / ED Diagnoses   Final diagnoses:  None    ED Discharge Orders    None       Geoffery Lyonselo, Shahla Betsill, MD 12/21/16 [phone]

## 2017-11-23 ENCOUNTER — Ambulatory Visit: Payer: BC Managed Care – PPO | Admitting: Advanced Practice Midwife

## 2017-11-23 NOTE — Progress Notes (Deleted)
   Subjective:     Diana Bowman is a 27 y.o. [facility] at [facility] for [facility].  Current complaints: ***.  Personal health questionnaire reviewed: {yes/no:9010}.   Gynecologic History No LMP recorded. (Menstrual status: Irregular Periods). Contraception: {method:5051} Last Pap: 2015. Results were: normal Last mammogram: n/a. Results were: n/a  Obstetric History OB History  No data available     {Common ambulatory SmartLinks:19316}  Review of Systems {ros; complete:30496}    Objective:    {exam; complete:18323}    Assessment:    Healthy female exam.    Plan:    {plan:19193}

## 2017-12-02 IMAGING — CR DG CHEST 2V
2 series · 2 of 2 positions shown · non-contrast
Comparison: 01/05/2012 .

CLINICAL DATA: Chest pain.

EXAM:
CHEST  2 VIEW

[w chest pa]
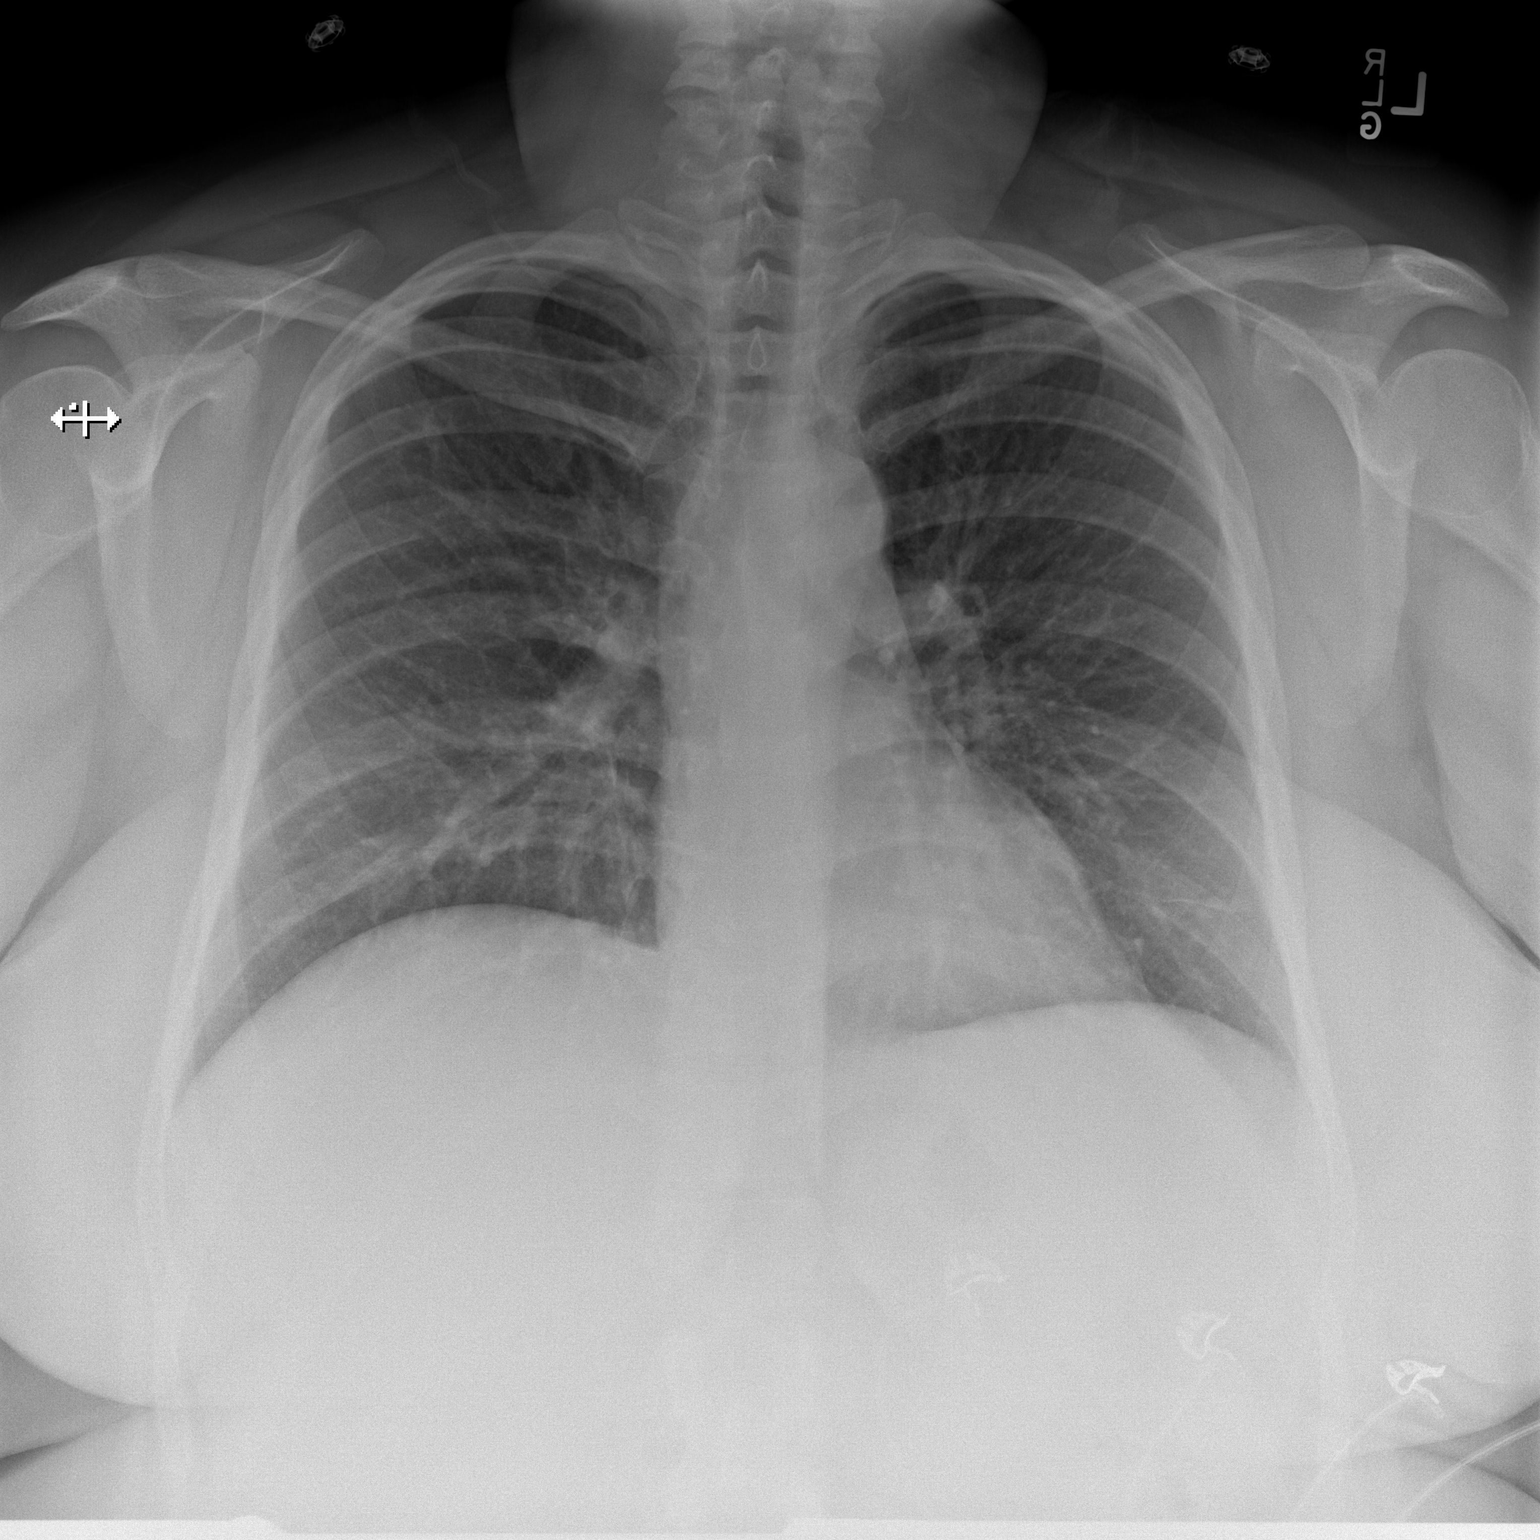

[w chest lat]
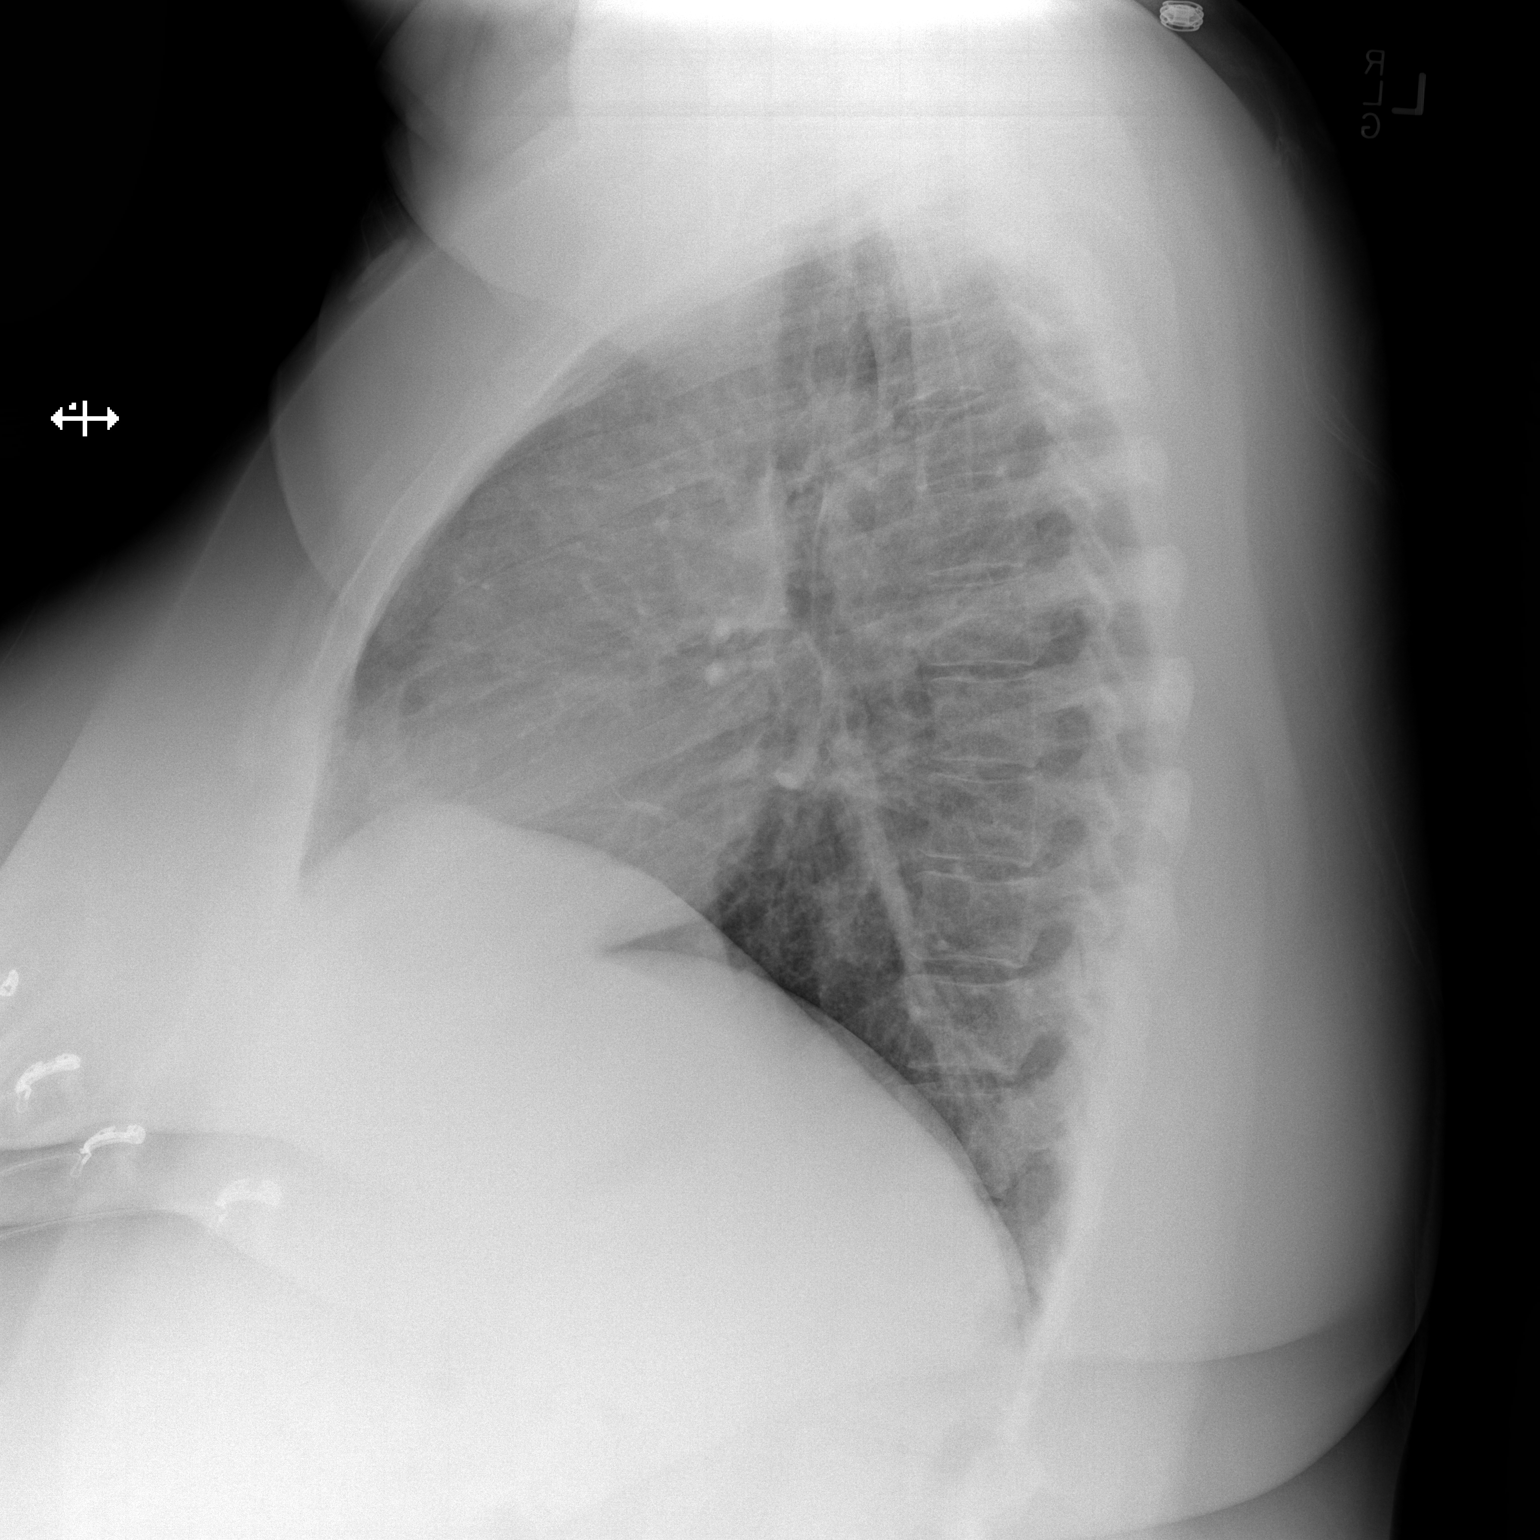

[2 of 2 positions shown; findings below may reference images not displayed]

FINDINGS: Mediastinum and hilar structures normal. Heart size normal . Low
lung volumes with mild stable basilar right upper lobe
pleural-parenchymal changes of scarring. No pleural effusion or
pneumothorax.
IMPRESSION: No acute cardiopulmonary disease.

## 2018-01-17 ENCOUNTER — Ambulatory Visit: Payer: BC Managed Care – PPO | Admitting: Neurology

## 2018-01-18 ENCOUNTER — Encounter: Payer: Self-pay | Admitting: Neurology

## 2018-08-07 ENCOUNTER — Ambulatory Visit: Payer: BC Managed Care – PPO | Admitting: Neurology

## 2018-09-06 ENCOUNTER — Ambulatory Visit: Payer: BC Managed Care – PPO | Admitting: Neurology

## 2018-09-06 ENCOUNTER — Telehealth: Payer: Self-pay | Admitting: *Deleted

## 2018-09-06 NOTE — Telephone Encounter (Signed)
No showed new patient appointment. 

## 2018-09-12 ENCOUNTER — Encounter: Payer: Self-pay | Admitting: Neurology

## 2018-09-14 ENCOUNTER — Other Ambulatory Visit: Payer: Self-pay

## 2018-09-14 DIAGNOSIS — Z20822 Contact with and (suspected) exposure to covid-19: Secondary | ICD-10-CM

## 2018-09-15 LAB — NOVEL CORONAVIRUS, NAA: SARS-CoV-2, NAA: NOT DETECTED

## 2018-12-12 ENCOUNTER — Other Ambulatory Visit: Payer: Self-pay

## 2018-12-12 DIAGNOSIS — Z20822 Contact with and (suspected) exposure to covid-19: Secondary | ICD-10-CM

## 2018-12-13 LAB — NOVEL CORONAVIRUS, NAA: SARS-CoV-2, NAA: NOT DETECTED

## 2019-05-11 ENCOUNTER — Telehealth: Payer: Self-pay | Admitting: Hematology and Oncology

## 2019-05-11 NOTE — Telephone Encounter (Signed)
Received a new hem referral from [facility] for abnl wbc. Pt has been cld and scheduled to see Dr. Leonides Schanz on 6/16 at 9am. Ms. Diana Bowman has been made aware to arrive 15 minutes early.

## 2019-06-19 NOTE — Progress Notes (Signed)
[facility] [phone]) [phone]   Fax:([phone]) [facility] NOTE  Patient Care Team: Bartholome Bill, MD as PCP - General (Family Medicine)  Hematological/Oncological History # Leukocytosis, Neutrophilic predominance #Erythrocytosis 1) 11/26/2012: WBC 19.8, Hgb 15.7, Plt 269, MCV 90.1  2) 09/12/2013: WBC 12.0, Hgb 15.5, Plt 339, MCV 89 3) 12/21/2016: WBC 18.3, Hgb 15.7, MV 92.8, Plt 284 4) 05/01/2019: WBC 15.9, Hgb 15.5, MCV 89.9, Plt 338. Neutrophilic predominance (ANC 10.6, 67% of WBC)  5) 06/20/2019: establish care with Dr. Lorenso Courier   CHIEF COMPLAINTS/PURPOSE OF CONSULTATION:  "Abnormal CBC "  HISTORY OF PRESENTING ILLNESS:  Diana Bowman 29 y.o. female with medical history significant for morbid obesity and  ovarians cysts presents for evaluation of chronic leukocytosis/erythrocytosis. She was referred by Dr. Precious Haws at [facility] in [place].   On review of the previous records Diana Bowman has a longstanding history of leukocytosis and erythrocytosis dating back to at least 11/26/2012.  At that time she was noted to have a white blood cell count of 19.8, hemoglobin 15.7, platelets of 269, with MCV of 90.1.  She has had elevations noted in 2015 as well as 2018 available in our system.  Most recently when seen by her primary care provider on 05/01/2019 she was found to have a white blood cell count of 15.9, hemoglobin 15.5, MCV of 89.9, and a platelet count of 338.  Due to concern for this longstanding leukocytosis and with her cytosis she was referred to hematology for further evaluation management.  On exam today Diana Bowman notes that she has been at her baseline level of health recently.  She notes that she did lose some weight, but unfortunately has recently gained it back.  She notes that one of her main concerns is that she has some discoloration of her feet and that her feet are sometimes quite sensitive.  She reports that she  is not having any issues with fevers, chills, sweats, nausea, vomiting or diarrhea.  She reports that she has not had any issues with lymphadenopathy or abdominal pain.    She has no personal or family history remarkable for any blood diseases.  She notes that she has a non-smoker, but does smoke approximately 1 joint of marijuana at night due to issues she has with insomnia.  Her roommate does note that she "breathes heavy" during her sleep, but no reports of frank snoring.  She does have a history of migraines and atypical menstrual cycles, but is currently set up for evaluation with OB/GYN.  A full 10 point ROS is listed below.  MEDICAL HISTORY:  Past Medical History:  Diagnosis Date   Bilateral ovarian cysts     SURGICAL HISTORY: History reviewed. No pertinent surgical history.  SOCIAL HISTORY: Social History   Socioeconomic History   Marital status: Single    Spouse name: Not on file   Number of children: Not on file   Years of education: Not on file   Highest education level: Not on file  Occupational History   Not on file  Tobacco Use   Smoking status: Never Smoker   Smokeless tobacco: Never Used  Vaping Use   Vaping Use: Never used  Substance and Sexual Activity   Alcohol use: No   Drug use: No   Sexual activity: Not Currently    Birth control/protection: None  Other Topics Concern   Not on file  Social History Narrative   Not on file   Social  Determinants of Health   Financial Resource Strain:    Difficulty of Paying Living Expenses:   Food Insecurity:    Worried About Charity fundraiser in the Last Year:    Arboriculturist in the Last Year:   Transportation Needs:    Film/video editor (Medical):    Lack of Transportation (Non-Medical):   Physical Activity:    Days of Exercise per Week:    Minutes of Exercise per Session:   Stress:    Feeling of Stress :   Social Connections:    Frequency of Communication with Friends and  Family:    Frequency of Social Gatherings with Friends and Family:    Attends Religious Services:    Active Member of Clubs or Organizations:    Attends Music therapist:    Marital Status:   Intimate Partner Violence:    Fear of Current or Ex-Partner:    Emotionally Abused:    Physically Abused:    Sexually Abused:     FAMILY HISTORY: Family History  Problem Relation Age of Onset   Diabetes Maternal Grandmother    Heart attack Maternal Grandfather    Kidney disease Paternal Grandfather    Diabetes Paternal Grandfather    Diabetes Maternal Aunt    Thrombosis Father     ALLERGIES:  has No Known Allergies.  MEDICATIONS:  Current Outpatient Medications  Medication Sig Dispense Refill   Multiple Vitamins-Minerals (MULTIVITAMIN GUMMIES WOMENS PO) Take by mouth daily.     No current facility-administered medications for this visit.    REVIEW OF SYSTEMS:   Constitutional: ( - ) fevers, ( - )  chills , ( - ) night sweats Eyes: ( - ) blurriness of vision, ( - ) double vision, ( - ) watery eyes Ears, nose, mouth, throat, and face: ( - ) mucositis, ( - ) sore throat Respiratory: ( - ) cough, ( - ) dyspnea, ( - ) wheezes Cardiovascular: ( - ) palpitation, ( - ) chest discomfort, ( - ) lower extremity swelling Gastrointestinal:  ( - ) nausea, ( - ) heartburn, ( - ) change in bowel habits Skin: ( - ) abnormal skin rashes Lymphatics: ( - ) new lymphadenopathy, ( - ) easy bruising Neurological: ( - ) numbness, ( - ) tingling, ( - ) new weaknesses Behavioral/Psych: ( - ) mood change, ( - ) new changes  All other systems were reviewed with the patient and are negative.  PHYSICAL EXAMINATION: ECOG PERFORMANCE STATUS: 0 - Asymptomatic  Vitals:   06/20/19 0915  BP: (!) 158/95  Pulse: (!) 108  Resp: 18  Temp: 98.1 F (36.7 C)  SpO2: 97%   Filed Weights   06/20/19 0915  Weight: (!) 342 lb 9.6 oz (155.4 kg)    GENERAL: well appearing obese  Caucasian female in NAD  SKIN: skin color, texture, turgor are normal, no rashes or significant lesions EYES: conjunctiva are pink and non-injected, sclera clear LUNGS: clear to auscultation and percussion with normal breathing effort HEART: regular rate & rhythm and no murmurs and no lower extremity edema ABDOMEN: exam limited 2/2 to body habitus.  Musculoskeletal: no cyanosis of digits and no clubbing  PSYCH: alert & oriented x 3, fluent speech NEURO: no focal motor/sensory deficits  LABORATORY DATA:  I have reviewed the data as listed CBC Latest Ref Rng & Units 12/21/2016 05/28/2016 09/12/2013  WBC 4.0 - 10.5 K/uL 18.3(H) 13.0(H) 12.0(H)  Hemoglobin 12.0 - 15.0 g/dL 15.7(H) 15.7(H)  15.5(H)  Hematocrit 36 - 46 % 47.9(H) 46.5(H) 44.5  Platelets 150 - 400 K/uL 284 267 339    CMP Latest Ref Rng & Units 12/21/2016 05/28/2016 04/26/2014  Glucose 65 - 99 mg/dL 136(H) 105(H) 88  BUN 6 - 20 mg/dL '12 12 12  '$ Creatinine 0.44 - 1.00 mg/dL 0.71 0.84 0.76  Sodium 135 - 145 mmol/L 137 140 141  Potassium 3.5 - 5.1 mmol/L 3.6 3.7 4.5  Chloride 101 - 111 mmol/L 102 111 105  CO2 22 - 32 mmol/L 22 21(L) 25  Calcium 8.9 - 10.3 mg/dL 8.6(L) 8.9 9.8  Total Protein 6.5 - 8.1 g/dL 7.5 - -  Total Bilirubin 0.3 - 1.2 mg/dL 0.8 - -  Alkaline Phos 38 - 126 U/L 72 - -  AST 15 - 41 U/L 30 - -  ALT 14 - 54 U/L 46 - -     BLOOD FILM:  Review of the peripheral blood smear showed normal appearing white cells with neutrophils that were increased in number, but otherwise appropriately lobated and granulated. There was no predominance of bi-lobed or hyper-segmented neutrophils appreciated. No Dohle bodies were noted. There was no left shifting, immature forms or blasts noted. Lymphocytes remain normal in size without any predominance of large granular lymphocytes. Red cells show no anisopoikilocytosis, macrocytes , microcytes or polychromasia. There were no schistocytes, target cells, echinocytes, acanthocytes,  dacrocytes, or stomatocytes.There was no rouleaux formation, nucleated red cells, or intra-cellular inclusions noted. The platelets are normal in size, shape, and color without any clumping evident.  RADIOGRAPHIC STUDIES: No results found.  ASSESSMENT & PLAN Diana Bowman 29 y.o. female with medical history significant for ovarians cysts presents for evaluation of chronic leukocytosis/erythrocytosis.  After review of the labs, discussion with the patient, reviewed the outside records the patient's findings are most consistent with a chronic leukocytosis and erythrocytosis.  The 2 most likely etiologies are myeloproliferative neoplasm versus obstructive sleep apnea.  I have slight favoritism towards obstructive sleep apnea given her clinical history of difficulty getting a good night sleep as well as her missed days from not being able to get out of bed.  I would recommend that she have a sleep study performed in order to confirm the diagnosis of obstructive sleep apnea.  An alternative etiology would be a myeloproliferative neoplasm.  In order to rule this out we will order MPN panels with JAK2 with reflex as well as BCR/ABL testing.  This is less likely but still a consideration.  In the event the patient is found to have a myeloproliferative neoplasm we would need to consider bone marrow biopsy to confirm and treatment will be based off the mutation discovered.  We will have the patient follow-up on a as needed basis pending the results of the above labs.  #Leukcytosis #Erythrocytosis --possible etiologies include MPN vs OSA. Her clinical history is significant for poor sleep and daytime somnolence --recommend a sleep study be performed to assess for OSA --MPN workup/evaluation with JAK2 w/ reflex and BCR/ABL testing -- no clear indication for a bone marrow biopsy at this time, though this can be considered if abnormalities are noted in the above labs --will also review CBC, CMP, and  peripheral blood film --RTC PRN pending the above results. Placeholder appointment scheduled in 6 months time.   #Borderline Low Vitamin B12 --noted to have Vitamin B12 of 205 on 05/01/2019. --check Vitamin b12 level today.   Orders Placed This Encounter  Procedures   CBC with Differential (Cancer  Center Only)    Standing Status:   Future    Standing Expiration Date:   06/18/2020   Save Smear (SSMR)    Standing Status:   Future    Standing Expiration Date:   06/18/2020   CMP ([facility] only)    Standing Status:   Future    Standing Expiration Date:   06/18/2020   Iron and TIBC    Standing Status:   Future    Standing Expiration Date:   06/18/2020   Ferritin    Standing Status:   Future    Standing Expiration Date:   06/18/2020   BCR ABL1 FISH ([facility])    Standing Status:   Future    Standing Expiration Date:   06/18/2020   JAK2 (INCLUDING V617F AND EXON 12), MPL,& CALR W/RFL MPN PANEL (NGS)    Standing Status:   Future    Standing Expiration Date:   06/18/2020   Vitamin B12    Standing Status:   Future    Standing Expiration Date:   06/18/2020   Ambulatory referral to Sleep Studies    Referral Priority:   Routine    Referral Type:   Consultation    Referral Reason:   Specialty Services Required    Number of Visits Requested:   1   All questions were answered. The patient knows to call the clinic with any problems, questions or concerns.  A total of more than 60 minutes were spent on this encounter and over half of that time was spent on counseling and coordination of care as outlined above.   Ledell Peoples, MD Department of Hematology/Oncology [place] at [facility] Phone: [phone] Pager: [phone] Email: [email or web address]  06/20/2019 10:01 AM

## 2019-06-20 ENCOUNTER — Encounter: Payer: Self-pay | Admitting: Hematology and Oncology

## 2019-06-20 ENCOUNTER — Inpatient Hospital Stay: Payer: BC Managed Care – PPO

## 2019-06-20 ENCOUNTER — Inpatient Hospital Stay: Payer: BC Managed Care – PPO | Attending: Hematology and Oncology | Admitting: Hematology and Oncology

## 2019-06-20 ENCOUNTER — Other Ambulatory Visit: Payer: Self-pay

## 2019-06-20 VITALS — BP 158/95 | HR 108 | Temp 98.1°F | Resp 18 | Ht 65.0 in | Wt 342.6 lb

## 2019-06-20 DIAGNOSIS — N83202 Unspecified ovarian cyst, left side: Secondary | ICD-10-CM | POA: Insufficient documentation

## 2019-06-20 DIAGNOSIS — G4733 Obstructive sleep apnea (adult) (pediatric): Secondary | ICD-10-CM

## 2019-06-20 DIAGNOSIS — E538 Deficiency of other specified B group vitamins: Secondary | ICD-10-CM | POA: Insufficient documentation

## 2019-06-20 DIAGNOSIS — D72825 Bandemia: Secondary | ICD-10-CM

## 2019-06-20 DIAGNOSIS — N83201 Unspecified ovarian cyst, right side: Secondary | ICD-10-CM | POA: Diagnosis not present

## 2019-06-20 DIAGNOSIS — F1290 Cannabis use, unspecified, uncomplicated: Secondary | ICD-10-CM | POA: Diagnosis not present

## 2019-06-20 DIAGNOSIS — D751 Secondary polycythemia: Secondary | ICD-10-CM

## 2019-06-20 DIAGNOSIS — D72829 Elevated white blood cell count, unspecified: Secondary | ICD-10-CM | POA: Diagnosis present

## 2019-06-20 DIAGNOSIS — Z832 Family history of diseases of the blood and blood-forming organs and certain disorders involving the immune mechanism: Secondary | ICD-10-CM | POA: Insufficient documentation

## 2019-06-20 LAB — CBC WITH DIFFERENTIAL (CANCER CENTER ONLY)
Abs Immature Granulocytes: 0.16 10*3/uL — ABNORMAL HIGH (ref 0.00–0.07)
Basophils Absolute: 0.1 10*3/uL (ref 0.0–0.1)
Basophils Relative: 0 %
Eosinophils Absolute: 0.3 10*3/uL (ref 0.0–0.5)
Eosinophils Relative: 2 %
HCT: 47.1 % — ABNORMAL HIGH (ref 36.0–46.0)
Hemoglobin: 15.8 g/dL — ABNORMAL HIGH (ref 12.0–15.0)
Immature Granulocytes: 1 %
Lymphocytes Relative: 21 %
Lymphs Abs: 2.9 10*3/uL (ref 0.7–4.0)
MCH: 30.7 pg (ref 26.0–34.0)
MCHC: 33.5 g/dL (ref 30.0–36.0)
MCV: 91.5 fL (ref 80.0–100.0)
Monocytes Absolute: 0.5 10*3/uL (ref 0.1–1.0)
Monocytes Relative: 4 %
Neutro Abs: 9.7 10*3/uL — ABNORMAL HIGH (ref 1.7–7.7)
Neutrophils Relative %: 72 %
Platelet Count: 315 10*3/uL (ref 150–400)
RBC: 5.15 MIL/uL — ABNORMAL HIGH (ref 3.87–5.11)
RDW: 13.7 % (ref 11.5–15.5)
WBC Count: 13.5 10*3/uL — ABNORMAL HIGH (ref 4.0–10.5)
nRBC: 0 % (ref 0.0–0.2)

## 2019-06-20 LAB — CMP (CANCER CENTER ONLY)
ALT: 48 U/L — ABNORMAL HIGH (ref 0–44)
AST: 27 U/L (ref 15–41)
Albumin: 3.6 g/dL (ref 3.5–5.0)
Alkaline Phosphatase: 91 U/L (ref 38–126)
Anion gap: 10 (ref 5–15)
BUN: 11 mg/dL (ref 6–20)
CO2: 24 mmol/L (ref 22–32)
Calcium: 9.1 mg/dL (ref 8.9–10.3)
Chloride: 107 mmol/L (ref 98–111)
Creatinine: 0.83 mg/dL (ref 0.44–1.00)
GFR, Est AFR Am: >60 mL/min (ref >60)
GFR, Estimated: >60 mL/min (ref >60)
Glucose, Bld: 186 mg/dL — ABNORMAL HIGH (ref 70–99)
Potassium: 3.8 mmol/L (ref 3.5–5.1)
Sodium: 141 mmol/L (ref 135–145)
Total Bilirubin: 0.4 mg/dL (ref 0.3–1.2)
Total Protein: 7.2 g/dL (ref 6.5–8.1)

## 2019-06-20 LAB — IRON AND TIBC
Iron: 54 ug/dL (ref 41–142)
Saturation Ratios: 16 % — ABNORMAL LOW (ref 21–57)
TIBC: 343 ug/dL (ref 236–444)
UIBC: 288 ug/dL (ref 120–384)

## 2019-06-20 LAB — FERRITIN: Ferritin: 182 ng/mL (ref 11–307)

## 2019-06-20 LAB — VITAMIN B12: Vitamin B-12: 243 pg/mL (ref 180–914)

## 2019-06-20 LAB — SAVE SMEAR(SSMR), FOR PROVIDER SLIDE REVIEW

## 2019-06-25 ENCOUNTER — Telehealth: Payer: Self-pay | Admitting: Hematology and Oncology

## 2019-06-25 NOTE — Telephone Encounter (Signed)
Scheduled per los. Called and left msg. Mailed printout  °

## 2019-06-27 LAB — JAK2 (INCLUDING V617F AND EXON 12), MPL,& CALR W/RFL MPN PANEL (NGS)

## 2019-06-27 LAB — BCR ABL1 FISH (GENPATH)

## 2019-07-02 ENCOUNTER — Other Ambulatory Visit: Payer: Self-pay | Admitting: Hematology and Oncology

## 2019-07-02 ENCOUNTER — Telehealth: Payer: Self-pay | Admitting: *Deleted

## 2019-07-02 DIAGNOSIS — D72825 Bandemia: Secondary | ICD-10-CM

## 2019-07-02 DIAGNOSIS — D751 Secondary polycythemia: Secondary | ICD-10-CM

## 2019-07-02 NOTE — Telephone Encounter (Signed)
-----   Message from Jaci Standard, MD sent at 07/02/2019 12:52 PM EDT ----- Please let Mrs. Trice know that her bloodwork did not show a reason for her high WBC and high Hgb. I most strongly suspect her findings are due to OSA. I will make a referral to sleep medicine today (can you make sure this goes through?)  Thanks,  Vonna Kotyk  ----- Message ----- From: Leory Plowman, [facility] [place] Sent: 06/20/2019  10:18 AM EDT To: Jaci Standard, MD

## 2019-07-02 NOTE — Telephone Encounter (Signed)
Received call back from patient and advised that her labs did not show a reason elevated WBC and elevated HGB. Dr. Leonides Schanz advises pt have  A sleep study done to see if OSA is the cause for these lab abnormalities.  She is agreeable to this. Advised that he has sent the referral and to expect to hear from that clinic in the next couple of weeks. She voiced understanding.

## 2019-07-02 NOTE — Telephone Encounter (Signed)
TCT patient regarding lab results.  No answer but was able to leave vm message for pt to call back to [phone] at her earliest convenience.

## 2019-07-25 ENCOUNTER — Emergency Department (HOSPITAL_COMMUNITY)
Admission: EM | Admit: 2019-07-25 | Discharge: 2019-07-25 | Disposition: A | Discharge status: Home / Self Care | Payer: BC Managed Care – PPO | Attending: Emergency Medicine | Admitting: Emergency Medicine

## 2019-07-25 ENCOUNTER — Encounter (HOSPITAL_COMMUNITY): Payer: Self-pay | Admitting: Emergency Medicine

## 2019-07-25 DIAGNOSIS — M545 Low back pain: Secondary | ICD-10-CM | POA: Diagnosis not present

## 2019-07-25 DIAGNOSIS — Z5321 Procedure and treatment not carried out due to patient leaving prior to being seen by health care provider: Secondary | ICD-10-CM | POA: Insufficient documentation

## 2019-07-25 DIAGNOSIS — R110 Nausea: Secondary | ICD-10-CM | POA: Diagnosis not present

## 2019-07-25 DIAGNOSIS — R1032 Left lower quadrant pain: Secondary | ICD-10-CM | POA: Diagnosis not present

## 2019-07-25 LAB — URINALYSIS, ROUTINE W REFLEX MICROSCOPIC
Bilirubin Urine: NEGATIVE
Glucose, UA: NEGATIVE mg/dL
Ketones, ur: NEGATIVE mg/dL
Leukocytes,Ua: NEGATIVE
Nitrite: NEGATIVE
Protein, ur: NEGATIVE mg/dL
Specific Gravity, Urine: 1.02 (ref 1.005–1.030)
pH: 5 (ref 5.0–8.0)

## 2019-07-25 NOTE — ED Notes (Signed)
Patient called X1 for blood draw

## 2019-07-25 NOTE — ED Triage Notes (Signed)
Per pt, states she started having LLQ pain radiating to back-nausea, no vomiting-no dysuria

## 2019-07-27 ENCOUNTER — Ambulatory Visit
Admission: EM | Admit: 2019-07-27 | Discharge: 2019-07-27 | Disposition: A | Discharge status: Home / Self Care | Payer: BC Managed Care – PPO

## 2019-12-19 ENCOUNTER — Telehealth: Payer: Self-pay | Admitting: Hematology and Oncology

## 2019-12-19 NOTE — Telephone Encounter (Signed)
Rescheduled appts per 12/15 incoming call. Pt stated she was not feeling well and wanted to reschedule appts. Pt confirmed appt date and time.

## 2019-12-20 ENCOUNTER — Inpatient Hospital Stay: Payer: BC Managed Care – PPO

## 2019-12-20 ENCOUNTER — Inpatient Hospital Stay: Payer: BC Managed Care – PPO | Admitting: Hematology and Oncology

## 2020-01-09 ENCOUNTER — Inpatient Hospital Stay: Payer: BC Managed Care – PPO | Attending: Family Medicine

## 2020-01-09 ENCOUNTER — Inpatient Hospital Stay: Payer: BC Managed Care – PPO | Admitting: Hematology and Oncology

## 2020-01-09 ENCOUNTER — Other Ambulatory Visit: Payer: Self-pay | Admitting: Hematology and Oncology

## 2020-01-09 DIAGNOSIS — D72825 Bandemia: Secondary | ICD-10-CM

## 2020-01-19 ENCOUNTER — Other Ambulatory Visit: Payer: Self-pay

## 2020-01-19 DIAGNOSIS — Z20822 Contact with and (suspected) exposure to covid-19: Secondary | ICD-10-CM

## 2020-01-22 LAB — NOVEL CORONAVIRUS, NAA: SARS-CoV-2, NAA: DETECTED — AB

## 2021-01-26 ENCOUNTER — Ambulatory Visit
Admission: EM | Admit: 2021-01-26 | Discharge: 2021-01-26 | Disposition: A | Discharge status: Home / Self Care | Payer: BC Managed Care – PPO

## 2021-01-26 ENCOUNTER — Other Ambulatory Visit: Payer: Self-pay

## 2021-01-26 DIAGNOSIS — J0300 Acute streptococcal tonsillitis, unspecified: Secondary | ICD-10-CM

## 2021-01-26 LAB — POCT RAPID STREP A (OFFICE): Rapid Strep A Screen: POSITIVE — AB

## 2021-01-26 MED ORDER — LIDOCAINE VISCOUS HCL 2 % MT SOLN: 5.0000 mL | OROMUCOSAL | 0 refills | Status: DC | PRN

## 2021-01-26 MED ORDER — AMOXICILLIN 875 MG PO TABS: 875.0000 mg | ORAL_TABLET | Freq: Two times a day (BID) | ORAL | 0 refills | Status: DC

## 2021-01-26 NOTE — ED Provider Notes (Signed)
[facility]    CSN: [id number] Arrival date & time: 01/26/21  1106      History   Chief Complaint Chief Complaint  Patient presents with   Sore Throat    HPI Diana Bowman is a 31 y.o. female.   Presenting today with 4-day history of progressively worsening sore, swollen throat, trouble swallowing, body aches, fatigue.  Denies known fever, chills, chest pain, shortness of breath, cough, congestion, vomiting, diarrhea.  So far trying Motrin with minimal relief of symptoms.  No known sick contacts recently.   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 No pertinent surgical history.  OB History   No obstetric history on file.      Home Medications    Prior to Admission medications   Medication Sig Start Date End Date Taking? Authorizing Provider  amoxicillin (AMOXIL) 875 MG tablet Take 1 tablet (875 mg total) by mouth 2 (two) times daily. 01/26/21  Yes Volney American, PA-C  lidocaine (XYLOCAINE) 2 % solution Use as directed 5 mLs in the mouth or throat every 3 (three) hours as needed for mouth pain. 01/26/21  Yes Volney American, PA-C  Multiple Vitamins-Minerals (MULTIVITAMIN GUMMIES WOMENS PO) Take by mouth daily.    [provider]  phentermine (ADIPEX-P) 37.5 MG tablet Take 37.5 mg by mouth daily. 01/19/21   [provider]    Family History Family History  Problem Relation Age of Onset   Diabetes Maternal Grandmother    Heart attack Maternal Grandfather    Kidney disease Paternal Grandfather    Diabetes Paternal Grandfather    Diabetes Maternal Aunt    Thrombosis Father     Social  History Social History   Tobacco Use   Smoking status: Never   Smokeless tobacco: Never  Vaping Use   Vaping Use: Never used  Substance Use Topics   Alcohol use: No   Drug use: Yes    Types: Marijuana     Allergies   Patient has no known allergies.   Review of Systems Review of Systems Per HPI  Physical Exam Triage Vital Signs ED Triage Vitals  Enc Vitals Group     BP 01/26/21 1212 (!) 118/95     Pulse Rate 01/26/21 1212 (!) 103     Resp 01/26/21 1212 18     Temp 01/26/21 1212 99.1 F (37.3 C)     Temp Source 01/26/21 1212 Oral     SpO2 01/26/21 1212 97 %     Weight --      Height --      Head Circumference --      Peak Flow --      Pain Score 01/26/21 1208 10     Pain Loc --      Pain Edu? --      Excl. in [place]? --    No data found.  Updated Vital Signs BP (!) 118/95 (BP Location: Right Arm)    Pulse (!) 103    Temp 99.1 F (37.3 C) (Oral)    Resp 18    LMP 01/17/2021 (Exact Date)    SpO2 97%   Visual Acuity Right  Eye Distance:   Left Eye Distance:   Bilateral Distance:    Right Eye Near:   Left Eye Near:    Bilateral Near:     Physical Exam Vitals and nursing note reviewed.  Constitutional:      Appearance: Normal appearance. She is not ill-appearing.  HENT:     Head: Atraumatic.     Nose: Nose normal.     Mouth/Throat:     Mouth: Mucous membranes are moist.     Pharynx: Oropharyngeal exudate and posterior oropharyngeal erythema present.     Comments: Uvula midline, oral airway patent Eyes:     Extraocular Movements: Extraocular movements intact.     Conjunctiva/sclera: Conjunctivae normal.  Cardiovascular:     Rate and Rhythm: Normal rate and regular rhythm.     Heart sounds: Normal heart sounds.  Pulmonary:     Effort: Pulmonary effort is normal.     Breath sounds: Normal breath sounds.  Musculoskeletal:        General: Normal range of motion.     Cervical back: Normal range of motion and neck supple.  Skin:    General: Skin is warm  and dry.  Neurological:     Mental Status: She is alert and oriented to person, place, and time.  Psychiatric:        Mood and Affect: Mood normal.        Thought Content: Thought content normal.        Judgment: Judgment normal.     UC Treatments / Results  Labs (all labs ordered are listed, but only abnormal results are displayed) Labs Reviewed  POCT RAPID STREP A (OFFICE) - Abnormal; Notable for the following components:      Result Value   Rapid Strep A Screen Positive (*)    All other components within normal limits    EKG   Radiology No results found.  Procedures Procedures (including critical care time)  Medications Ordered in UC Medications - No data to display  Initial Impression / Assessment and Plan / UC Course  I have reviewed the triage vital signs and the nursing notes.  Pertinent labs & imaging results that were available during my care of the patient were reviewed by me and considered in my medical decision making (see chart for details).     Rapid strep positive treat with amoxicillin, viscous lidocaine, salt water gargles, supportive home care.  Work note given.  Return for acutely worsening symptoms.  Final Clinical Impressions(s) / UC Diagnoses   Final diagnoses:  Strep tonsillitis   Discharge Instructions   None    ED Prescriptions     Medication Sig Dispense Auth. Provider   amoxicillin (AMOXIL) 875 MG tablet Take 1 tablet (875 mg total) by mouth 2 (two) times daily. 20 tablet Volney American, [place]   lidocaine (XYLOCAINE) 2 % solution Use as directed 5 mLs in the mouth or throat every 3 (three) hours as needed for mouth pain. 100 mL Volney American, [place]      PDMP not reviewed this encounter.   Volney American, [place] 01/26/21 1258

## 2021-01-26 NOTE — ED Triage Notes (Signed)
Patient states that on Thursday her throat started hurting more and more everyday  Patient states that she has a slight headache.  Patient states she is so much pain from swallowing that she is scared to swallow.  Patient may have been exposed at work.   Patient denies Fever  Patient states she took 2 home Covid tests and both were negative

## 2022-09-12 DIAGNOSIS — O1490 Unspecified pre-eclampsia, unspecified trimester: Secondary | ICD-10-CM

## 2023-07-18 ENCOUNTER — Telehealth: Payer: Self-pay | Admitting: Hematology and Oncology

## 2023-07-18 NOTE — Telephone Encounter (Signed)
 Rescheule appointment per provider pal.  Called left VM with changes made to the upcoming appointment.

## 2023-07-25 ENCOUNTER — Ambulatory Visit (HOSPITAL_BASED_OUTPATIENT_CLINIC_OR_DEPARTMENT_OTHER): Admission: EM | Admit: 2023-07-25 | Discharge: 2023-07-25 | Disposition: A | Discharge status: Home / Self Care

## 2023-07-25 ENCOUNTER — Encounter (HOSPITAL_BASED_OUTPATIENT_CLINIC_OR_DEPARTMENT_OTHER): Payer: Self-pay | Admitting: Emergency Medicine

## 2023-07-25 DIAGNOSIS — R110 Nausea: Secondary | ICD-10-CM

## 2023-07-25 DIAGNOSIS — R55 Syncope and collapse: Secondary | ICD-10-CM | POA: Diagnosis not present

## 2023-07-25 HISTORY — DX: Essential (primary) hypertension: I10

## 2023-07-25 NOTE — Discharge Instructions (Signed)
 I believe the patient had a near syncopal event or near fainting event, probably secondary to her blood pressure medication.  Encouraged to monitor for any feelings of dizziness when she stands.  Encouraged good hydration.  Her blood pressure medication is a diuretic and could dehydrate her.  Follow-up here if needed but definitely follow-up with family practice about her blood pressure.

## 2023-07-25 NOTE — ED Provider Notes (Signed)
 [facility]    CSN: [id number] Arrival date & time: 07/25/23  1323      History   Chief Complaint Chief Complaint  Patient presents with   Headache   Nausea    HPI Diana Bowman is a 33 y.o. female.   Patient newly diagnosed with hypertension in the last few months.  She is on Triamterene-Hydrochlorothiazide 37.5-25 mg, 1/2 a pill daily.  Approximately 35 to 40 minutes ago, she had acute onset feeling of nearly passing out, she got hot all over, her vision blurred, she felt unsteady, she felt nauseous.  She did not vomit and she did not pass out but she really felt like she would.  The sensation lasted for 10 to 20 minutes.  She had something cold to drink and that seemed to help some.  A family member brought her here if she did not drive.  She is newly on blood pressure medicine in the last month.  And did take her medicine today.   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 No pertinent surgical history.  OB History     Gravida  1   Para  1   Term  1   Preterm      AB      Living  1      SAB      IAB      Ectopic      Multiple      Live Births  1            Home Medications    Prior to Admission medications   Medication Sig Start Date End Date Taking? Authorizing Provider  triamterene-hydrochlorothiazide (MAXZIDE-25) 37.5-25 MG tablet Take 0.5 tablets by mouth. 06/14/23   Yes [provider]  lidocaine  (XYLOCAINE ) 2 % solution Use as directed 5 mLs in the mouth or throat every 3 (three) hours as needed for mouth pain. 01/26/21   Stuart Vernell Norris, PA-C  Multiple Vitamins-Minerals (MULTIVITAMIN GUMMIES WOMENS PO) Take by mouth daily.    [provider]  phentermine (ADIPEX-P) 37.5 MG tablet Take 37.5 mg by mouth daily. 01/19/21   [provider]    Family History Family History  Problem Relation Age of Onset   Diabetes Maternal Grandmother    Heart attack Maternal Grandfather    Kidney disease Paternal Grandfather    Diabetes Paternal Grandfather    Diabetes Maternal Aunt    Thrombosis Father     Social History Social History   Tobacco Use   Smoking status: Never   Smokeless tobacco: Never  Vaping Use   Vaping status: Never Used  Substance Use Topics   Alcohol use: No   Drug use: Yes    Types: Marijuana     Allergies   Latex   Review of Systems Review of Systems  Constitutional:  Negative for chills and fever.  HENT:  Negative for ear pain and sore throat.   Eyes:  Negative for pain and visual disturbance.  Respiratory:  Negative for cough and shortness of breath.   Cardiovascular:  Negative for chest pain and palpitations.  Gastrointestinal:  Positive for nausea. Negative for abdominal pain, constipation, diarrhea and vomiting.  Genitourinary:  Negative for dysuria and hematuria.  Musculoskeletal:  Negative for arthralgias and back pain.  Skin:  Negative for color change and rash.  Neurological:  Positive for dizziness, syncope (Near syncope), weakness and headaches. Negative for seizures.  All other systems reviewed and are negative.    Physical Exam Triage Vital Signs ED Triage Vitals  Encounter Vitals Group     BP 07/25/23 1337 124/87     Girls Systolic BP Percentile --      Girls Diastolic BP Percentile --      Boys Systolic BP Percentile --      Boys Diastolic BP Percentile --      Pulse  Rate 07/25/23 1337 83     Resp 07/25/23 1337 18     Temp 07/25/23 1337 98.1 F (36.7 C)     Temp Source 07/25/23 1337 Oral     SpO2 07/25/23 1337 97 %     Weight --      Height --      Head Circumference --      Peak Flow --      Pain Score 07/25/23 1335 8     Pain Loc --      Pain Education --      Exclude from Growth Chart --    Orthostatic VS for the past 24 hrs:  BP- Lying Pulse- Lying BP- Sitting Pulse- Sitting BP- Standing at 0 minutes Pulse- Standing at 0 minutes  07/25/23 1405 112/78 79 126/87 79 118/82 85    Updated Vital Signs BP 124/87 (BP Location: Right Arm)   Pulse 83   Temp 98.1 F (36.7 C) (Oral)   Resp 18   LMP 06/21/2023   SpO2 97%   Visual Acuity Right Eye Distance:   Left Eye Distance:   Bilateral Distance:    Right Eye Near:   Left Eye Near:    Bilateral Near:     Physical Exam Vitals and nursing note reviewed.  Constitutional:      General: She is not in acute distress.    Appearance: She is well-developed. She is not ill-appearing or toxic-appearing.  HENT:     Head: Normocephalic and atraumatic.     Right Ear: Hearing, tympanic membrane, ear canal and external ear normal.     Left Ear: Hearing, tympanic membrane, ear canal and external ear normal.     Nose: No congestion or rhinorrhea.     Right Sinus: No maxillary sinus tenderness or frontal sinus tenderness.     Left Sinus: No maxillary sinus tenderness or frontal sinus tenderness.     Mouth/Throat:     Lips: Pink.     Mouth: Mucous membranes are moist.     Pharynx: Uvula midline. No oropharyngeal exudate or posterior oropharyngeal erythema.     Tonsils: No tonsillar exudate.  Eyes:     Conjunctiva/sclera: Conjunctivae normal.     Pupils: Pupils are equal, round, and reactive to light.  Cardiovascular:     Rate and Rhythm: Normal rate and regular rhythm.     Heart sounds: S1 normal and S2 normal. No murmur heard. Pulmonary:     Effort: Pulmonary effort is normal. No respiratory  distress.     Breath sounds: Normal breath sounds. No decreased breath sounds, wheezing, rhonchi or rales.  Abdominal:     General: Bowel sounds are normal.     Palpations: Abdomen is soft.     Tenderness: There is no abdominal tenderness.  Musculoskeletal:        General: No swelling.     Cervical back: Neck supple.  Lymphadenopathy:     Head:     Right side of head: No submental, submandibular, tonsillar, preauricular or posterior auricular adenopathy.     Left side of head: No submental, submandibular, tonsillar, preauricular or posterior auricular adenopathy.     Cervical: No cervical adenopathy.     Right cervical: No superficial cervical adenopathy.    Left cervical: No superficial cervical adenopathy.  Skin:    General: Skin is warm and dry.     Capillary Refill: Capillary refill takes less than 2 seconds.     Findings: No rash.  Neurological:     Mental Status: She is alert and oriented to person, place, and time.     Cranial Nerves: Cranial nerves 2-12 are intact.     Sensory: Sensation is intact.     Coordination: Coordination is intact.     Gait: Gait is intact.  Psychiatric:        Mood and Affect: Mood normal.      UC Treatments / Results  Labs (all labs ordered are listed, but only abnormal results are displayed) Labs Reviewed - No data to display  EKG   Radiology No results found.  Procedures Procedures (including critical care time)  Medications Ordered in UC Medications - No data to display  Initial Impression / Assessment and Plan / UC Course  I have reviewed the triage vital signs and the nursing notes.  Pertinent labs & imaging results that were available during my care of the patient were reviewed by me and considered in my medical decision making (see chart for details).  Plan of Care: Near syncope and nausea: Believe this was secondary to low blood pressure reaction from her blood pressure medication.  Encouraged good hydration.  Monitor  for any similar symptoms and try to sit down and check blood pressure if you are feeling like you might pass out.  Follow-up with family practice, may need to change to an alternative blood pressure medicine.  I reviewed the plan of care with the patient and/or the patient's guardian.  The patient and/or guardian had time to ask questions and acknowledged that the questions were answered.  I provided instruction on symptoms or reasons to return here or to go to an ER, if symptoms/condition did not improve, worsened or if new symptoms occurred.  Final Clinical Impressions(s) / UC Diagnoses   Final diagnoses:  Nausea without vomiting  Near syncope     Discharge Instructions      I believe the patient had a near syncopal event or near fainting event, probably secondary to her blood pressure medication.  Encouraged to monitor for any feelings of dizziness when she stands.  Encouraged good hydration.  Her blood pressure medication is a diuretic and could dehydrate her.  Follow-up here if needed but definitely follow-up with family practice about her blood pressure.     ED Prescriptions   None    PDMP not reviewed this encounter.   Ival Domino, FNP 07/25/23 1434

## 2023-07-25 NOTE — ED Triage Notes (Signed)
 Pt reports she recently has been placed on blood pressure medication called Triamterene-hydrochlorothiazide 37.5-25 mg she take a 1/2 tablet daily. Pt c/o blurred vision, headache, nausea that started about 20 minutes ago. Pt did take her medication today.

## 2023-07-27 ENCOUNTER — Encounter: Payer: Self-pay | Admitting: Hematology and Oncology

## 2023-07-27 ENCOUNTER — Other Ambulatory Visit: Payer: Self-pay

## 2023-08-10 ENCOUNTER — Inpatient Hospital Stay: Payer: Self-pay

## 2023-08-10 ENCOUNTER — Inpatient Hospital Stay: Payer: Self-pay | Attending: Hematology and Oncology | Admitting: Hematology and Oncology

## 2023-08-10 VITALS — BP 153/88 | HR 71 | Temp 97.7°F | Resp 14 | Wt 290.5 lb

## 2023-08-10 DIAGNOSIS — D72825 Bandemia: Secondary | ICD-10-CM

## 2023-08-10 DIAGNOSIS — E669 Obesity, unspecified: Secondary | ICD-10-CM | POA: Diagnosis not present

## 2023-08-10 DIAGNOSIS — F17290 Nicotine dependence, other tobacco product, uncomplicated: Secondary | ICD-10-CM

## 2023-08-10 DIAGNOSIS — I10 Essential (primary) hypertension: Secondary | ICD-10-CM | POA: Diagnosis not present

## 2023-08-10 DIAGNOSIS — F1290 Cannabis use, unspecified, uncomplicated: Secondary | ICD-10-CM | POA: Insufficient documentation

## 2023-08-10 LAB — CBC WITH DIFFERENTIAL (CANCER CENTER ONLY)
Abs Immature Granulocytes: 0.1 K/uL — ABNORMAL HIGH (ref 0.00–0.07)
Basophils Absolute: 0.1 K/uL (ref 0.0–0.1)
Basophils Relative: 1 %
Eosinophils Absolute: 0.3 K/uL (ref 0.0–0.5)
Eosinophils Relative: 2 %
HCT: 43.1 % (ref 36.0–46.0)
Hemoglobin: 14.9 g/dL (ref 12.0–15.0)
Immature Granulocytes: 1 %
Lymphocytes Relative: 26 %
Lymphs Abs: 3.5 K/uL (ref 0.7–4.0)
MCH: 29.6 pg (ref 26.0–34.0)
MCHC: 34.6 g/dL (ref 30.0–36.0)
MCV: 85.5 fL (ref 80.0–100.0)
Monocytes Absolute: 0.6 K/uL (ref 0.1–1.0)
Monocytes Relative: 5 %
Neutro Abs: 8.7 K/uL — ABNORMAL HIGH (ref 1.7–7.7)
Neutrophils Relative %: 65 %
Platelet Count: 359 K/uL (ref 150–400)
RBC: 5.04 MIL/uL (ref 3.87–5.11)
RDW: 13.6 % (ref 11.5–15.5)
WBC Count: 13.2 K/uL — ABNORMAL HIGH (ref 4.0–10.5)
nRBC: 0 % (ref 0.0–0.2)

## 2023-08-10 LAB — C-REACTIVE PROTEIN: CRP: 1.7 mg/dL — ABNORMAL HIGH (ref <1.0)

## 2023-08-10 LAB — CMP (CANCER CENTER ONLY)
ALT: 15 U/L (ref 0–44)
AST: 14 U/L — ABNORMAL LOW (ref 15–41)
Albumin: 4.2 g/dL (ref 3.5–5.0)
Alkaline Phosphatase: 91 U/L (ref 38–126)
Anion gap: 5 (ref 5–15)
BUN: 10 mg/dL (ref 6–20)
CO2: 31 mmol/L (ref 22–32)
Calcium: 9.1 mg/dL (ref 8.9–10.3)
Chloride: 105 mmol/L (ref 98–111)
Creatinine: 0.7 mg/dL (ref 0.44–1.00)
GFR, Estimated: >60 mL/min (ref >60)
Glucose, Bld: 112 mg/dL — ABNORMAL HIGH (ref 70–99)
Potassium: 3.6 mmol/L (ref 3.5–5.1)
Sodium: 141 mmol/L (ref 135–145)
Total Bilirubin: 0.4 mg/dL (ref 0.0–1.2)
Total Protein: 7 g/dL (ref 6.5–8.1)

## 2023-08-10 LAB — SEDIMENTATION RATE: Sed Rate: 12 mm/h (ref 0–22)

## 2023-08-10 LAB — LACTATE DEHYDROGENASE: LDH: 148 U/L (ref 98–192)

## 2023-08-10 NOTE — Progress Notes (Signed)
 [facility] [phone]   [phone]  INITIAL CONSULT NOTE  Patient Care Team: Jolee Madelin Patch, MD as PCP - General (Family Medicine)  Hematological/Oncological History # Leukocytosis, Chronic. Neutrophilic Predominance.  08/10/2023: establish care with Dr. Federico   CHIEF COMPLAINTS/PURPOSE OF CONSULTATION:  Leukocytosis, Chronic    HISTORY OF PRESENTING ILLNESS:  Diana Bowman 33 y.o. female with medical history significant for hypertension and obesity who presents for evaluation of chronic leukocytosis.  On review of the previous records Diana Bowman has a longstanding history of leukocytosis dating back to at least 2015.  At the time she had white blood cell 12.0, hemoglobin 15.5, MCV 89.0, and platelets of 339.  Most recently on 05/17/2023 patient had a white blood cell 12.9, hemoglobin 14.0, MCV 86, and platelets of 327.  Her leukocytosis is always of neutrophilic predominance.  Due to concern for these findings the patient was referred to hematology for further evaluation and management.  On exam today Diana Bowman reports that she has had elevated white blood cell count for a long time.  She reports it did increase during her prior pregnancy.  She reports lately she has had no issues with nausea, vomiting, or diarrhea.  She has had no fevers, chills, sweats or runny nose, sore throat.  She reports does not have any joint pain or rash.  She is a non-smoker.  She does endorse however that she has an upcoming sleep test.  She reports does have a lot of daytime sleepiness but does not snore.  On further discussion she reports her mother had heart disease, type 2 diabetes, COPD.  She does not know her father side of the family history.  She reports she has 1 healthy child.  Her maternal grandmother had type 2 diabetes and COPD and her maternal grandfather had a heart attack.  She reports that she used to drink alcohol but does no longer.  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  She otherwise denies any recent changes in her health.  A full 10 point ROS is otherwise negative.  MEDICAL HISTORY:  Past Medical History:  Diagnosis Date   Bilateral ovarian cysts    Hypertension     SURGICAL HISTORY: No past surgical history on file.  SOCIAL HISTORY: Social History   Socioeconomic History   Marital status: Single    Spouse name: Not on file   Number of children: Not on file   Years of education: Not on file   Highest education level: Not on file  Occupational History   Not on file  Tobacco Use   Smoking status: Never   Smokeless tobacco: Never  Vaping Use   Vaping status: Never Used  Substance and Sexual Activity   Alcohol use: No   Drug use: Yes    Types: Marijuana   Sexual activity: Not Currently    Birth control/protection: None  Other Topics Concern   Not on file  Social History Narrative   Not on file   Social Drivers of Health   Financial Resource Strain: Not on file  Food Insecurity: No Food Insecurity (08/10/2023)   Hunger Vital Sign    Worried About Running Out of Food in the Last Year: Never true    Ran Out of Food in the Last Year: Never true  Transportation Needs: No Transportation Needs (08/10/2023)   PRAPARE - Transportation    Lack of Transportation (Medical): No    Lack  of Transportation (Non-Medical): No  Physical Activity: Not on file  Stress: Not on file  Social Connections: Not on file  Intimate Partner Violence: Not At Risk (08/10/2023)   Humiliation, Afraid, Rape, and Kick questionnaire    Fear of Current or Ex-Partner: No    Emotionally Abused: No    Physically Abused: No    Sexually Abused: No    FAMILY HISTORY: Family History  Problem Relation Age of Onset   Diabetes Maternal Grandmother    Heart attack Maternal Grandfather    Kidney disease Paternal Grandfather    Diabetes Paternal Grandfather    Diabetes Maternal Aunt    Thrombosis Father      ALLERGIES:  is allergic to latex.  MEDICATIONS:  No current outpatient medications on file.   No current facility-administered medications for this visit.    REVIEW OF SYSTEMS:   Constitutional: ( - ) fevers, ( - )  chills , ( - ) night sweats Eyes: ( - ) blurriness of vision, ( - ) double vision, ( - ) watery eyes Ears, nose, mouth, throat, and face: ( - ) mucositis, ( - ) sore throat Respiratory: ( - ) cough, ( - ) dyspnea, ( - ) wheezes Cardiovascular: ( - ) palpitation, ( - ) chest discomfort, ( - ) lower extremity swelling Gastrointestinal:  ( - ) nausea, ( - ) heartburn, ( - ) change in bowel habits Skin: ( - ) abnormal skin rashes Lymphatics: ( - ) new lymphadenopathy, ( - ) easy bruising Neurological: ( - ) numbness, ( - ) tingling, ( - ) new weaknesses Behavioral/Psych: ( - ) mood change, ( - ) new changes  All other systems were reviewed with the patient and are negative.  PHYSICAL EXAMINATION:  Vitals:   08/10/23 1353  BP: (!) 153/88  Pulse: 71  Resp: 14  Temp: 97.7 F (36.5 C)  SpO2: 100%   Filed Weights   08/10/23 1353  Weight: 290 lb 8 oz (131.8 kg)    GENERAL: well appearing middle-age Caucasian female in NAD  SKIN: skin color, texture, turgor are normal, no rashes or significant lesions EYES: conjunctiva are pink and non-injected, sclera clear LUNGS: clear to auscultation and percussion with normal breathing effort HEART: regular rate & rhythm and no murmurs and no lower extremity edema Musculoskeletal: no cyanosis of digits and no clubbing  PSYCH: alert & oriented x 3, fluent speech NEURO: no focal motor/sensory deficits  LABORATORY DATA:  I have reviewed the data as listed    Latest Ref Rng & Units 08/10/2023    3:21 PM 06/20/2019   10:07 AM 12/21/2016    6:32 AM  CBC  WBC 4.0 - 10.5 K/uL 13.2  13.5  18.3   Hemoglobin 12.0 - 15.0 g/dL 85.0  84.1  84.2   Hematocrit 36.0 - 46.0 % 43.1  47.1  47.9   Platelets 150 - 400 K/uL 359  315  284         Latest Ref Rng & Units 08/10/2023    3:21 PM 06/20/2019   10:07 AM 12/21/2016    6:32 AM  CMP  Glucose 70 - 99 mg/dL 887  813  863   BUN 6 - 20 mg/dL 10  11  12    Creatinine 0.44 - 1.00 mg/dL 9.29  9.16  9.28   Sodium 135 - 145 mmol/L 141  141  137   Potassium 3.5 - 5.1 mmol/L 3.6  3.8  3.6   Chloride 98 -  111 mmol/L 105  107  102   CO2 22 - 32 mmol/L 31  24  22    Calcium 8.9 - 10.3 mg/dL 9.1  9.1  8.6   Total Protein 6.5 - 8.1 g/dL 7.0  7.2  7.5   Total Bilirubin 0.0 - 1.2 mg/dL 0.4  0.4  0.8   Alkaline Phos 38 - 126 U/L 91  91  72   AST 15 - 41 U/L 14  27  30    ALT 0 - 44 U/L 15  48  46      ASSESSMENT & PLAN Diana Bowman 33 y.o. female with medical history significant for hypertension and obesity who presents for evaluation of chronic leukocytosis.  After review of the labs, review of the records, and discussion with the patient the patients findings are most consistent with leukocytosis, likely secondary to OSA.  The patient does not have any known inflammatory disorders and has no infectious symptoms.  Additionally she is a non-smoker.  In the interest of being thorough we will order an MPN workup today.  Neutrophilic predominant leukocytosis is a common hematological finding with numerous possible etiologies. Possible causes include chronic inflammation, infection (transient or chronic), medication side effect (particularly steroids), myeloproliferative neoplasm, or idiopathic. The most common cause of chronic inflammation causing leukocytosis is cigarette smoking. Obstructive sleep apnea can also produce a similar pattern of leukocytosis. If there is no clear cause (or if there is an increase in other cell lines) a myeloproliferative neoplasm workup should be conducted with JAK2 w/ reflex and BCR/ABL FISH.  Idiopathic cases should be observed for stability.    # Leukocytosis, Neutrophilic Predominant  --today will order CBC, CMP, and LDH  --inflammatory workup with  ESR and CRP  --recommend testing for OSA  --patient is a nonsmoker with no focal signs concerning for infection.  --will proceed with MPN workup to include JAK2 w/ reflex and BCR/ABL FISH   --RTC in 6 months or sooner if indicated by above workup.     Orders Placed This Encounter  Procedures   CBC with Differential (Cancer Center Only)    Standing Status:   Future    Number of Occurrences:   1    Expiration Date:   08/09/2024   CMP (Cancer Center only)    Standing Status:   Future    Number of Occurrences:   1    Expiration Date:   08/09/2024   Lactate dehydrogenase (LDH)    Standing Status:   Future    Number of Occurrences:   1    Expiration Date:   08/09/2024   Sedimentation rate    Standing Status:   Future    Number of Occurrences:   1    Expiration Date:   08/09/2024   C-reactive protein    Standing Status:   Future    Number of Occurrences:   1    Expiration Date:   08/09/2024   JAK2 V617 reflex CALR/MPL/E12-15    Standing Status:   Future    Number of Occurrences:   1    Expiration Date:   08/09/2024   BCR-ABL1 FISH    Standing Status:   Future    Number of Occurrences:   1    Expiration Date:   08/09/2024    All questions were answered. The patient knows to call the clinic with any problems, questions or concerns.  A total of more than 60 minutes were spent on this encounter with  face-to-face time and non-face-to-face time, including preparing to see the patient, ordering tests and/or medications, counseling the patient and coordination of care as outlined above.   Norleen IVAR Kidney, MD Department of Hematology/Oncology [facility] Phone: [phone] Pager: [phone] Email: [email or web address]  08/10/2023 8:11 PM

## 2023-08-11 ENCOUNTER — Encounter: Payer: Self-pay | Admitting: Hematology and Oncology

## 2023-08-16 ENCOUNTER — Encounter: Payer: Self-pay | Admitting: Hematology and Oncology

## 2023-08-19 LAB — BCR-ABL1 FISH
Cells Analyzed: 200
Cells Counted: 200

## 2023-08-25 LAB — JAK2 V617F RFX CALR/MPL/E12-15

## 2023-08-25 LAB — CALR +MPL + E12-E15  (REFLEX)

## 2024-02-07 ENCOUNTER — Other Ambulatory Visit: Payer: Self-pay | Admitting: Hematology and Oncology

## 2024-02-07 DIAGNOSIS — D72825 Bandemia: Secondary | ICD-10-CM

## 2024-02-08 ENCOUNTER — Inpatient Hospital Stay

## 2024-02-08 ENCOUNTER — Inpatient Hospital Stay: Admitting: Hematology and Oncology
